# Patient Record
Sex: Male | Born: 1990 | Race: White | Hispanic: No | Marital: Married | Smoking: Never smoker
Health system: Southern US, Community
[De-identification: ages and names within clinical notes are randomized; demographics above are authoritative.]

## PROBLEM LIST (undated history)

## (undated) DIAGNOSIS — F419 Anxiety disorder, unspecified: Secondary | ICD-10-CM

## (undated) DIAGNOSIS — G43909 Migraine, unspecified, not intractable, without status migrainosus: Secondary | ICD-10-CM

## (undated) DIAGNOSIS — I10 Essential (primary) hypertension: Secondary | ICD-10-CM

## (undated) HISTORY — DX: Anxiety disorder, unspecified: F41.9

## (undated) HISTORY — DX: Migraine, unspecified, not intractable, without status migrainosus: G43.909

## (undated) HISTORY — PX: MYRINGOTOMY: SUR874

---

## 2013-08-18 ENCOUNTER — Encounter: Payer: Self-pay | Admitting: Physician Assistant

## 2013-08-18 ENCOUNTER — Ambulatory Visit (INDEPENDENT_AMBULATORY_CARE_PROVIDER_SITE_OTHER): Payer: BC Managed Care – PPO | Admitting: Physician Assistant

## 2013-08-18 VITALS — BP 120/65 | HR 101 | Ht 71.0 in | Wt 203.0 lb

## 2013-08-18 DIAGNOSIS — I499 Cardiac arrhythmia, unspecified: Secondary | ICD-10-CM | POA: Insufficient documentation

## 2013-08-18 DIAGNOSIS — F329 Major depressive disorder, single episode, unspecified: Secondary | ICD-10-CM | POA: Insufficient documentation

## 2013-08-18 DIAGNOSIS — F341 Dysthymic disorder: Secondary | ICD-10-CM

## 2013-08-18 DIAGNOSIS — F32A Depression, unspecified: Secondary | ICD-10-CM | POA: Insufficient documentation

## 2013-08-18 DIAGNOSIS — F410 Panic disorder [episodic paroxysmal anxiety] without agoraphobia: Secondary | ICD-10-CM

## 2013-08-18 MED ORDER — CITALOPRAM HYDROBROMIDE 10 MG PO TABS: 10.0000 mg | ORAL_TABLET | Freq: Every day | ORAL | Status: DC

## 2013-08-18 MED ORDER — CLONAZEPAM 0.5 MG PO TABS: ORAL_TABLET | ORAL | Status: DC

## 2013-08-18 NOTE — Patient Instructions (Signed)

## 2013-08-18 NOTE — Progress Notes (Signed)
   Subjective:    Patient ID: Juan Andersen, male    DOB: Apr 28, 1991, 22 y.o.   MRN: [id number]  HPI Patient is a 22 yo male who presents to the clinic to establish care and to discuss ongoing anxiety and depression. Pt has been on zoloft and xanax in the past. Xanax totally sedated him and made him not hisself. zoloft made him very nauseated. He suffers with anxiety and depression but depression is much worse. He does have thoughts that life would be better without him but has not made a plan in years. He can also get very anxious and have panic attacks. Hx of panic attacks that were so bad that his arms and legs went numb. No recent stressors.   Per pt seen by cardiologist for irregular heart beat. No treatment initiated. No SOB, CP. Does not exercise. No problems or concerns today.        Review of Systems  All other systems reviewed and are negative.       Objective:   Physical Exam  Constitutional: He is oriented to person, place, and time. He appears well-developed and well-nourished.  HENT:  Head: Normocephalic and atraumatic.  Cardiovascular: Regular rhythm and normal heart sounds.   Tachycardia at 101.   Pulmonary/Chest: Effort normal and breath sounds normal.  Neurological: He is alert and oriented to person, place, and time.  Skin: Skin is warm and dry.  Psychiatric: He has a normal mood and affect. His behavior is normal.          Assessment & Plan:  Anxiety/depression/panic attacks- PHQ-9 was 17. GAD-7 was 17. Will start celexa 10mg  daily and klonapin for panic attacks only. Discussed side effects or medication. Stop if worsens depression. Only use klonapin PrN. F/U in 6 weeks.   Irregular heartbeat- waiting for records from cardiologist to review and see if any follow up is needed. Pt is asymptomatic today.

## 2013-09-17 ENCOUNTER — Encounter: Payer: Self-pay | Admitting: Physician Assistant

## 2013-09-17 ENCOUNTER — Ambulatory Visit (INDEPENDENT_AMBULATORY_CARE_PROVIDER_SITE_OTHER): Payer: BC Managed Care – PPO | Admitting: Physician Assistant

## 2013-09-17 VITALS — BP 144/82 | HR 105 | Wt 213.0 lb

## 2013-09-17 DIAGNOSIS — A084 Viral intestinal infection, unspecified: Secondary | ICD-10-CM

## 2013-09-17 DIAGNOSIS — A088 Other specified intestinal infections: Secondary | ICD-10-CM

## 2013-09-17 MED ORDER — PROMETHAZINE HCL 25 MG PO TABS: 25.0000 mg | ORAL_TABLET | Freq: Four times a day (QID) | ORAL | Status: DC | PRN

## 2013-09-17 MED ORDER — TRAMADOL HCL 50 MG PO TABS: 50.0000 mg | ORAL_TABLET | Freq: Four times a day (QID) | ORAL | Status: DC | PRN

## 2013-09-17 MED ORDER — DICYCLOMINE HCL 10 MG PO CAPS: 10.0000 mg | ORAL_CAPSULE | Freq: Three times a day (TID) | ORAL | Status: DC

## 2013-09-17 NOTE — Patient Instructions (Addendum)
Viral Gastroenteritis  Viral gastroenteritis is also known as stomach flu. This condition affects the stomach and intestinal tract. It can cause sudden diarrhea and vomiting. The illness typically lasts 3 to 8 days. Most people develop an immune response that eventually gets rid of the virus. While this natural response develops, the virus can make you quite ill.  CAUSES   Many different viruses can cause gastroenteritis, such as rotavirus or noroviruses. You can catch one of these viruses by consuming contaminated food or water. You may also catch a virus by sharing utensils or other personal items with an infected person or by touching a contaminated surface.  SYMPTOMS   The most common symptoms are diarrhea and vomiting. These problems can cause a severe loss of body fluids (dehydration) and a body salt (electrolyte) imbalance. Other symptoms may include:  · Fever.  · Headache.  · Fatigue.  · Abdominal pain.  DIAGNOSIS   Your caregiver can usually diagnose viral gastroenteritis based on your symptoms and a physical exam. A stool sample may also be taken to test for the presence of viruses or other infections.  TREATMENT   This illness typically goes away on its own. Treatments are aimed at rehydration. The most serious cases of viral gastroenteritis involve vomiting so severely that you are not able to keep fluids down. In these cases, fluids must be given through an intravenous line (IV).  HOME CARE INSTRUCTIONS   · Drink enough fluids to keep your urine clear or pale yellow. Drink small amounts of fluids frequently and increase the amounts as tolerated.  · Ask your caregiver for specific rehydration instructions.  · Avoid:  · Foods high in sugar.  · Alcohol.  · Carbonated drinks.  · Tobacco.  · Juice.  · Caffeine drinks.  · Extremely hot or cold fluids.  · Fatty, greasy foods.  · Too much intake of anything at one time.  · Dairy products until 24 to 48 hours after diarrhea stops.  · You may consume probiotics.  Probiotics are active cultures of beneficial bacteria. They may lessen the amount and number of diarrheal stools in adults. Probiotics can be found in yogurt with active cultures and in supplements.  · Wash your hands well to avoid spreading the virus.  · Only take over-the-counter or prescription medicines for pain, discomfort, or fever as directed by your caregiver. Do not give aspirin to children. Antidiarrheal medicines are not recommended.  · Ask your caregiver if you should continue to take your regular prescribed and over-the-counter medicines.  · Keep all follow-up appointments as directed by your caregiver.  SEEK IMMEDIATE MEDICAL CARE IF:   · You are unable to keep fluids down.  · You do not urinate at least once every 6 to 8 hours.  · You develop shortness of breath.  · You notice blood in your stool or vomit. This may look like coffee grounds.  · You have abdominal pain that increases or is concentrated in one small area (localized).  · You have persistent vomiting or diarrhea.  · You have a fever.  · The patient is a child younger than 3 months, and he or she has a fever.  · The patient is a child older than 3 months, and he or she has a fever and persistent symptoms.  · The patient is a child older than 3 months, and he or she has a fever and symptoms suddenly get worse.  · The patient is a baby, and he   or she has no tears when crying.  MAKE SURE YOU:   · Understand these instructions.  · Will watch your condition.  · Will get help right away if you are not doing well or get worse.  Document Released: 08/28/2005 Document Revised: 11/20/2011 Document Reviewed: 06/14/2011  ExitCare® Patient Information ©2014 [employer].  Diet for Diarrhea, Adult  Frequent, runny stools (diarrhea) may be caused or worsened by food or drink. Diarrhea may be relieved by changing your diet. Since diarrhea can last up to 7 days, it is easy for you to lose too much fluid from the body and become dehydrated. Fluids that are  lost need to be replaced. Along with a modified diet, make sure you drink enough fluids to keep your urine clear or pale yellow.  DIET INSTRUCTIONS  · Ensure adequate fluid intake (hydration): have 1 cup (8 oz) of fluid for each diarrhea episode. Avoid fluids that contain simple sugars or sports drinks, fruit juices, whole milk products, and sodas. Your urine should be clear or pale yellow if you are drinking enough fluids. Hydrate with an oral rehydration solution that you can purchase at pharmacies, retail stores, and online. You can prepare an oral rehydration solution at home by mixing the following ingredients together:  ·   tsp table salt.  · ¾ tsp baking soda.  ·  tsp salt substitute containing potassium chloride.  · 1  tablespoons sugar.  · 1 L (34 oz) of water.  · Certain foods and beverages may increase the speed at which food moves through the gastrointestinal (GI) tract. These foods and beverages should be avoided and include:  · Caffeinated and alcoholic beverages.  · High-fiber foods, such as raw fruits and vegetables, nuts, seeds, and whole grain breads and cereals.  · Foods and beverages sweetened with sugar alcohols, such as xylitol, sorbitol, and mannitol.  · Some foods may be well tolerated and may help thicken stool including:  · Starchy foods, such as rice, toast, pasta, low-sugar cereal, oatmeal, grits, baked potatoes, crackers, and bagels.    · Bananas.    · Applesauce.  · Add probiotic-rich foods to help increase healthy bacteria in the GI tract, such as yogurt and fermented milk products.  RECOMMENDED FOODS AND BEVERAGES  Starches  Choose foods with less than 2 g of fiber per serving.  · Recommended:  White, French, and pita breads, plain rolls, buns, bagels. Plain muffins, matzo. Soda, saltine, or graham crackers. Pretzels, melba toast, zwieback. Cooked cereals made with water: cornmeal, farina, cream cereals. Dry cereals: refined corn, wheat, rice. Potatoes prepared any way without skins,  refined macaroni, spaghetti, noodles, refined rice.  · Avoid:  Bread, rolls, or crackers made with whole wheat, multi-grains, rye, bran seeds, nuts, or coconut. Corn tortillas or taco shells. Cereals containing whole grains, multi-grains, bran, coconut, nuts, raisins. Cooked or dry oatmeal. Coarse wheat cereals, granola. Cereals advertised as "high-fiber." Potato skins. Whole grain pasta, wild or brown rice. Popcorn. Sweet potatoes, yams. Sweet rolls, doughnuts, waffles, pancakes, sweet breads.  Vegetables  · Recommended: Strained tomato and vegetable juices. Most well-cooked and canned vegetables without seeds. Fresh: Tender lettuce, cucumber without the skin, cabbage, spinach, bean sprouts.  · Avoid: Fresh, cooked, or canned: Artichokes, baked beans, beet greens, broccoli, Brussels sprouts, corn, kale, legumes, peas, sweet potatoes. Cooked: Green or red cabbage, spinach. Avoid large servings of any vegetables because vegetables shrink when cooked, and they contain more fiber per serving than fresh vegetables.  Fruit  · Recommended: Cooked   or canned: Apricots, applesauce, cantaloupe, cherries, fruit cocktail, grapefruit, grapes, kiwi, mandarin oranges, peaches, pears, plums, watermelon. Fresh: Apples without skin, ripe banana, grapes, cantaloupe, cherries, grapefruit, peaches, oranges, plums. Keep servings limited to ½ cup or 1 piece.  · Avoid: Fresh: Apples with skin, apricots, mangoes, pears, raspberries, strawberries. Prune juice, stewed or dried prunes. Dried fruits, raisins, dates. Large servings of all fresh fruits.  Protein  · Recommended: Ground or well-cooked tender beef, ham, veal, lamb, pork, or poultry. Eggs. Fish, oysters, shrimp, lobster, other seafoods. Liver, organ meats.  · Avoid: Tough, fibrous meats with gristle. Peanut butter, smooth or chunky. Cheese, nuts, seeds, legumes, dried peas, beans, lentils.  Dairy  · Recommended: Yogurt, lactose-free milk, kefir, drinkable yogurt, buttermilk, soy  milk, or plain hard cheese.  · Avoid: Milk, chocolate milk, beverages made with milk, such as milkshakes.  Soups  · Recommended: Bouillon, broth, or soups made from allowed foods. Any strained soup.  · Avoid: Soups made from vegetables that are not allowed, cream or milk-based soups.  Desserts and Sweets  · Recommended: Sugar-free gelatin, sugar-free frozen ice pops made without sugar alcohol.  · Avoid: Plain cakes and cookies, pie made with fruit, pudding, custard, cream pie. Gelatin, fruit, ice, sherbet, frozen ice pops. Ice cream, ice milk without nuts. Plain hard candy, honey, jelly, molasses, syrup, sugar, chocolate syrup, gumdrops, marshmallows.  Fats and Oils  · Recommended: Limit fats to less than 8 tsp per day.  · Avoid: Seeds, nuts, olives, avocados. Margarine, butter, cream, mayonnaise, salad oils, plain salad dressings. Plain gravy, crisp bacon without rind.  Beverages  · Recommended: Water, decaffeinated teas, oral rehydration solutions, sugar-free beverages not sweetened with sugar alcohols.  · Avoid: Fruit juices, caffeinated beverages (coffee, tea, soda), alcohol, sports drinks, or lemon-lime soda.  Condiments  · Recommended: Ketchup, mustard, horseradish, vinegar, cocoa powder. Spices in moderation: allspice, basil, bay leaves, celery powder or leaves, cinnamon, cumin powder, curry powder, ginger, mace, marjoram, onion or garlic powder, oregano, paprika, parsley flakes, ground pepper, rosemary, sage, savory, tarragon, thyme, turmeric.  · Avoid: Coconut, honey.  Document Released: 11/18/2003 Document Revised: 05/22/2012 Document Reviewed: 01/12/2012  ExitCare® Patient Information ©2014 [employer].

## 2013-09-17 NOTE — Progress Notes (Signed)
   Subjective:    Patient ID: Juan Andersen, male    DOB: 10-24-1990, 23 y.o.   MRN: [id number]  HPI Patient presents to the clinic as a 23 year old white male with 2 days of nausea and vomiting. Symptoms started 2 days ago with nausea and abdominal discomfort. Symptoms have progressed to abdominal cramping and pain and ongoing vomiting. Last time he vomited was this morning. He is able to keep most fluids down. He did decide to eat today and chose [employer] Big Mac. He soon felt a lot of abdominal cramping and pain and nausea. He did not throw up after lunch. Patient does feel achy throughout his body. He tried to go to work yesterday but felt too dizzy to run machinery. Denies any diarrhea. Has not ran a fever. Denies any sore throat, ear pain, sinus pressure, shortness of breath, cough or wheezing.    Review of Systems     Objective:   Physical Exam  Constitutional: He is oriented to person, place, and time. He appears well-developed and well-nourished.  HENT:  Head: Normocephalic and atraumatic.  Right Ear: External ear normal.  Left Ear: External ear normal.  Mouth/Throat: Oropharynx is clear and moist. No oropharyngeal exudate.  Eyes: Conjunctivae are normal.  Neck: Normal range of motion. Neck supple.  Cardiovascular: Normal rate, regular rhythm and normal heart sounds.   Pulmonary/Chest: Effort normal and breath sounds normal. He has no wheezes.  Abdominal: Soft. Bowel sounds are normal. There is tenderness.  Generalized tenderness over entire abdomen. Negative Murphy sign.  Lymphadenopathy:    He has no cervical adenopathy.  Neurological: He is alert and oriented to person, place, and time.  Skin: Skin is warm and dry.  Psychiatric: He has a normal mood and affect. His behavior is normal.          Assessment & Plan:  Viral gastroenteritis- gave patient handout on symptomatic control. Give patient Phenergan to take orally at home. Also sent home and told to use up to 4 times  a day for abdominal cramping and spasms. Discussed progressing his diet slowly. Discouraged harsh foods such as [employer]. Continue to stay hydrated to avoid dehydration. Discussed adding Gatorade to give electrolytes. 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 Call if not improving.

## 2013-10-01 ENCOUNTER — Ambulatory Visit (INDEPENDENT_AMBULATORY_CARE_PROVIDER_SITE_OTHER): Payer: BC Managed Care – PPO | Admitting: Physician Assistant

## 2013-10-01 ENCOUNTER — Encounter: Payer: Self-pay | Admitting: Physician Assistant

## 2013-10-01 VITALS — BP 133/79 | HR 86 | Wt 212.0 lb

## 2013-10-01 DIAGNOSIS — F419 Anxiety disorder, unspecified: Principal | ICD-10-CM

## 2013-10-01 DIAGNOSIS — F329 Major depressive disorder, single episode, unspecified: Secondary | ICD-10-CM

## 2013-10-01 DIAGNOSIS — F341 Dysthymic disorder: Secondary | ICD-10-CM

## 2013-10-01 DIAGNOSIS — F410 Panic disorder [episodic paroxysmal anxiety] without agoraphobia: Secondary | ICD-10-CM

## 2013-10-01 MED ORDER — CITALOPRAM HYDROBROMIDE 20 MG PO TABS: 20.0000 mg | ORAL_TABLET | Freq: Every day | ORAL | Status: DC

## 2013-10-01 NOTE — Progress Notes (Signed)
   Subjective:    Patient ID: Juan ParsleyCody Andersen, male    DOB: 08-Mar-1991, 23 y.o.   MRN: [id number]  HPI Pt presents to the clinic to follow up on anxiety/depression and panic attacks. Pt is taking celexa at bedtime and klonapin as needed. He has only had to take klonapin a few times. There was some nausea with taking celexa in the am but taking at bedtime has worked out. He does feel like he has had "cold sensation" and hot sensations a lot with medication. He reports they are only in public and when he is out and about. Admits he has social anxiety problems. He also has some tingling all over body. Episodes only last a few seconds to minutes. Resolve on there on. Denies any palpitations, CP, vision changes.    Review of Systems     Objective:   Physical Exam  Constitutional: He is oriented to person, place, and time. He appears well-developed and well-nourished.  HENT:  Head: Normocephalic and atraumatic.  Cardiovascular: Normal rate, regular rhythm and normal heart sounds.   Pulmonary/Chest: Effort normal and breath sounds normal.  Neurological: He is alert and oriented to person, place, and time.  Skin: Skin is warm and dry.  Psychiatric: He has a normal mood and affect. His behavior is normal.          Assessment & Plan:  Anxiety and Depression/panic attacks- PHQ-9 was 8 and GAD was 7. Numbers have significantly improved. Increased celexa to 20mg  daily at night. klonapin only as needed. Pt reports feelings of cold sensation/hot sensations/tingling over body but sounds like he could be having a panic attack. Encouraged him to use klonapin and see if helped it would then know is panic attack. Hopefully celexa will decrease frequency. Pt concerned cold sensation could be side effect of celexa because they have worsened over 6 weeks. If worsens with increase call office and will change celexa. Discussed celexa can worsen anxiety as SE;however, numbers are better than ever so is helping. Will  continue to monitor. F/u in 6-8 weeks.

## 2013-10-18 ENCOUNTER — Other Ambulatory Visit: Payer: Self-pay | Admitting: Physician Assistant

## 2013-11-12 ENCOUNTER — Ambulatory Visit: Payer: BC Managed Care – PPO | Admitting: Physician Assistant

## 2013-11-12 DIAGNOSIS — Z0289 Encounter for other administrative examinations: Secondary | ICD-10-CM

## 2014-01-31 ENCOUNTER — Other Ambulatory Visit: Payer: Self-pay | Admitting: Physician Assistant

## 2015-03-31 ENCOUNTER — Emergency Department (INDEPENDENT_AMBULATORY_CARE_PROVIDER_SITE_OTHER)
Admission: EM | Admit: 2015-03-31 | Discharge: 2015-03-31 | Disposition: A | Discharge status: Home / Self Care | Payer: Self-pay | Source: Home / Self Care | Attending: Family Medicine | Admitting: Family Medicine

## 2015-03-31 ENCOUNTER — Encounter: Payer: Self-pay | Admitting: *Deleted

## 2015-03-31 ENCOUNTER — Emergency Department (INDEPENDENT_AMBULATORY_CARE_PROVIDER_SITE_OTHER): Payer: Self-pay

## 2015-03-31 DIAGNOSIS — M79662 Pain in left lower leg: Secondary | ICD-10-CM

## 2015-03-31 DIAGNOSIS — L03116 Cellulitis of left lower limb: Secondary | ICD-10-CM

## 2015-03-31 DIAGNOSIS — S8992XA Unspecified injury of left lower leg, initial encounter: Secondary | ICD-10-CM

## 2015-03-31 MED ORDER — CEPHALEXIN 500 MG PO CAPS: 500.0000 mg | ORAL_CAPSULE | Freq: Two times a day (BID) | ORAL | Status: DC

## 2015-03-31 NOTE — Discharge Instructions (Signed)
You may take ibuprofen  every 6-8 hours as needed for pain and swelling.  Keep your leg elevated when you can to help keep swelling down. Be sure to keep area clean with soap and water.  Take antibiotics as prescribed, and complete the entire course to help prevent infection from returning or worsening.  If symptoms do worsen including significant swelling, pain, worsening redness, or other new concerning symptoms develop, please seek medical attention for further evaluation and treatment. See below for further instructions.

## 2015-03-31 NOTE — ED Notes (Signed)
Pt employer, [employer] requested UDS. Done.

## 2015-03-31 NOTE — ED Provider Notes (Signed)
CSN: [id number]     Arrival date & time 03/31/15  1623 History   First MD Initiated Contact with Patient 03/31/15 1637     Chief Complaint  Patient presents with  . Leg Pain   (Consider location/radiation/quality/duration/timing/severity/associated sxs/prior Treatment) HPI Patient is a 24 year old male presenting to urgent care with complaints of left lower leg pain after injury at work 2 days ago.  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 He was helping his partner load a "fluid filled wall" into the back of a truck when his partner accidentally lost his grip, causing the wall to hit the patients Left lower leg and then slide down his leg. States his leg was caught between the wall and the truck for a few seconds. He experienced immediate pain and swelling to area. Pain is aching and sore, 4/10, worse with ambulation and palpation. States swelling in lower leg was worse yesterday but has improved significantly with ibuprofen and ice.  Pt states he still has a red warm bump to the front of his left lower leg. No other injuries.   History reviewed. No pertinent past medical history. History reviewed. No pertinent past surgical history. Family History  Problem Relation Age of Onset  . Hyperlipidemia Father   . Hypertension Father   . Alcohol abuse Maternal Uncle   . Depression Maternal Uncle   . Diabetes Paternal Aunt   . Hyperlipidemia Paternal Aunt   . Alcohol abuse Paternal Uncle   . Hyperlipidemia Paternal Uncle   . Diabetes Maternal Grandmother   . Stroke Maternal Grandmother   . Cancer Maternal Grandfather   . Heart attack Paternal Grandfather   . Diabetes Paternal Grandfather   . Hyperlipidemia Paternal Grandfather    History  Substance Use Topics  . Smoking status: Never Smoker   . Smokeless tobacco: Not on file  . Alcohol Use: No    Review of Systems  Musculoskeletal: Positive for myalgias, joint swelling, arthralgias and gait problem. Negative for  back pain, neck pain and neck stiffness.  Skin: Positive for color change. Negative for pallor, rash and wound.  Neurological: Negative for weakness and numbness.    Allergies  Review of patient's allergies indicates no known allergies.  Home Medications   Prior to Admission medications   Medication Sig Start Date End Date Taking? Authorizing Provider  cephALEXin (KEFLEX) 500 MG capsule Take 1 capsule (500 mg total) by mouth 2 (two) times daily. For 7 days 03/31/15   Junius Finner, PA-C  citalopram (CELEXA) 10 MG tablet TAKE ONE TABLET BY MOUTH ONE TIME DAILY  10/18/13   Jomarie Longs, PA-C  clonazePAM (KLONOPIN) 0.5 MG tablet Take one tablet as needed for panic attacks. 08/18/13   [name] L Breeback, PA-C   BP 154/89 mmHg  Pulse 83  Resp 16  Wt 234 lb (106.142 kg)  SpO2 99% Physical Exam  Constitutional: He is oriented to person, place, and time. He appears well-developed and well-nourished.  HENT:  Head: Normocephalic and atraumatic.  Eyes: EOM are normal.  Neck: Normal range of motion.  Cardiovascular: Normal rate.   Pulmonary/Chest: Effort normal.  Musculoskeletal: Normal range of motion. He exhibits edema and tenderness.  Left Leg: FROM Left knee, non-tender. No tenderness to proximal tibia and fibular. Calf is soft, no tenderness to calf muscle. Tenderness and mild edema with hematoma over anterior distal tibia. Significant tenderness. (See skin exam)  Left ankle and foot: non-tender, FROM. Increased pain in Lower leg with full dorsiflexion.  Neurological: He is alert and oriented to person, place, and time.  Skin: Skin is warm and dry. There is erythema.  Left lower anterior leg: 3cm area of erythema and warmth, centralized area of edema and tenderness. No fluctuance. No bleeding or discharge.  Psychiatric: He has a normal mood and affect. His behavior is normal.  Nursing note and vitals reviewed.   ED Course  Procedures (including critical care time) Labs Review Labs  Reviewed - No data to display  Imaging Review Dg Tibia/fibula Left  03/31/2015   CLINICAL DATA:  Injury to LEFT lower leg 3 days ago, a barrier filled with water fell onto LEFT leg, patient having swelling, tenderness and redness over distal anterior LEFT lower leg  EXAM: LEFT TIBIA AND FIBULA - 2 VIEW  COMPARISON:  None  FINDINGS: Knee and ankle joint alignments normal.  Osseous mineralization normal for technique.  No acute fracture, dislocation or bone destruction.  Minimal soft tissue swelling pretibial at the distal lower leg as well as over the dorsum of the midfoot.  IMPRESSION: No acute osseous abnormalities.   Electronically Signed   By: Ulyses SouthwardMark  Boles M.D.   On: 03/31/2015 17:32     MDM   1. Injury, lower leg, left, initial encounter   2. Cellulitis of left lower leg     [facility] pain redness, swelling and warmth after injury at work 2 days ago. Symptoms are improving except continue redness and warmth at injury site.  Doubt compartment syndrome as compartments are soft. Small area of erythema c/w hematoma vs early onset cellulitis w/o abscess. Will place pt on 7 day course of keflex. Advised he may continue to take ibuprofen, up to 600mg  every 6-8 hours as needed for pain and swelling. Encouraged to use R.I.C.E therapy. Ace wrap provided today in UC.  Pt may return to work with 24 hours of light duty.  Requested pt be able to elevate leg during breaks for 10-15 minutes at a time. Return precautions provided. Pt verbalized understanding and agreement with tx plan.     Junius FinnerErin O'Malley, PA-C 03/31/15 [phone]

## 2015-03-31 NOTE — ED Notes (Signed)
Pt reports while @ work 2 days ago, [employer], [employer], his left lower leg was hit with a "fliud filled wall", smashed between that and a trailer. Redness and swelling present. Taking IBF otc.

## 2015-08-30 ENCOUNTER — Ambulatory Visit (INDEPENDENT_AMBULATORY_CARE_PROVIDER_SITE_OTHER): Payer: 59 | Admitting: Physician Assistant

## 2015-08-30 ENCOUNTER — Encounter: Payer: Self-pay | Admitting: Physician Assistant

## 2015-08-30 VITALS — BP 150/91 | HR 86 | Ht 71.0 in | Wt 240.0 lb

## 2015-08-30 DIAGNOSIS — I10 Essential (primary) hypertension: Secondary | ICD-10-CM | POA: Diagnosis not present

## 2015-08-30 DIAGNOSIS — F410 Panic disorder [episodic paroxysmal anxiety] without agoraphobia: Secondary | ICD-10-CM | POA: Diagnosis not present

## 2015-08-30 DIAGNOSIS — F329 Major depressive disorder, single episode, unspecified: Secondary | ICD-10-CM

## 2015-08-30 DIAGNOSIS — F418 Other specified anxiety disorders: Secondary | ICD-10-CM

## 2015-08-30 DIAGNOSIS — F419 Anxiety disorder, unspecified: Secondary | ICD-10-CM

## 2015-08-30 MED ORDER — ALPRAZOLAM 0.5 MG PO TABS: 0.5000 mg | ORAL_TABLET | Freq: Two times a day (BID) | ORAL | Status: DC | PRN

## 2015-08-30 MED ORDER — BUPROPION HCL ER (XL) 150 MG PO TB24: 150.0000 mg | ORAL_TABLET | Freq: Every day | ORAL | Status: DC

## 2015-08-31 DIAGNOSIS — I10 Essential (primary) hypertension: Secondary | ICD-10-CM | POA: Insufficient documentation

## 2015-08-31 NOTE — Progress Notes (Signed)
   Subjective:    Patient ID: Juan ParsleyCody Gangi, male    DOB: 11-10-1990, 24 y.o.   MRN: [id number]  HPI Patient is a 24 year old male who presents to the clinic to follow-up on anxiety and depression. He had switched to a doctor's office closer to his house but did not really like the office. He would like to start coming here again. While he was there he was started on lisinopril for his high blood pressure. He has ran out and not taking the last 5 days. He denies any chest pain, dizziness, headaches or palpitations. His main concern is his chronic anxiety and panic attacks. He does feel like his anxiety is getting worse. On average she is having 3 panic attacks a day. During the panic attacks he gets extremely hot and sweaty. He feels like he has to sit down or he is going to pass out. He is unsure of the exact trigger however it seems to be a lot of public situations. He has witnessed a lot of death in his lifetime and he has notice his panic gets worse around death. His grandmother recently passed away as well. He takes Klonopin only as needed but does not feel like it gets rid of the panic attacks as it should. Sometimes it will take him 1 hour to calm down after a panic attack. He denies any suicidal or homicidal thoughts. His is on paxil but has not noticed any benefit.    Review of Systems  All other systems reviewed and are negative.      Objective:   Physical Exam  Constitutional: He is oriented to person, place, and time. He appears well-developed and well-nourished.  HENT:  Head: Normocephalic and atraumatic.  Cardiovascular: Normal rate, regular rhythm and normal heart sounds.   Pulmonary/Chest: Effort normal and breath sounds normal.  Neurological: He is alert and oriented to person, place, and time.  Skin:  Flushed cheeks   Psychiatric: He has a normal mood and affect. His behavior is normal.          Assessment & Plan:  HTN- discuss with patient his blood pressure is very  elevated today. He needs to make sure he is taking his lisinopril. Made sure he had refills the patient is to have blood pressure rechecked in the next month.  Anxiety/depression/panic attacks-pH Q9 was 8 and GAD-7 was 14. I did switch klonapin to xanax up to twice a day. Discussed abuse potential and to only use as needed. Patient was encouraged to stay on Paxil. I did add Wellbutrin 150 mg. We'll recheck in 4-6 weeks.

## 2015-09-09 ENCOUNTER — Emergency Department (INDEPENDENT_AMBULATORY_CARE_PROVIDER_SITE_OTHER)
Admission: EM | Admit: 2015-09-09 | Discharge: 2015-09-09 | Disposition: A | Discharge status: Home / Self Care | Payer: 59 | Source: Home / Self Care

## 2015-09-09 ENCOUNTER — Encounter: Payer: Self-pay | Admitting: *Deleted

## 2015-09-09 ENCOUNTER — Emergency Department (INDEPENDENT_AMBULATORY_CARE_PROVIDER_SITE_OTHER): Payer: 59

## 2015-09-09 DIAGNOSIS — M79671 Pain in right foot: Secondary | ICD-10-CM

## 2015-09-09 DIAGNOSIS — S86311A Strain of muscle(s) and tendon(s) of peroneal muscle group at lower leg level, right leg, initial encounter: Secondary | ICD-10-CM

## 2015-09-09 DIAGNOSIS — M25571 Pain in right ankle and joints of right foot: Secondary | ICD-10-CM

## 2015-09-09 DIAGNOSIS — S93601A Unspecified sprain of right foot, initial encounter: Secondary | ICD-10-CM

## 2015-09-09 DIAGNOSIS — M76821 Posterior tibial tendinitis, right leg: Secondary | ICD-10-CM | POA: Diagnosis not present

## 2015-09-09 HISTORY — DX: Essential (primary) hypertension: I10

## 2015-09-09 MED ORDER — PREDNISONE 20 MG PO TABS: ORAL_TABLET | ORAL | Status: DC

## 2015-09-09 MED ORDER — TRAMADOL HCL 50 MG PO TABS: ORAL_TABLET | ORAL | Status: DC

## 2015-09-09 NOTE — ED Notes (Signed)
Pt needs UDS per supervisor Warden FillersSam Edwards. Done. Clemens Catholichristy Grettell Ransdell, LPN

## 2015-09-09 NOTE — ED Notes (Addendum)
Workers comp [employer]: Pt c/o RT foot/ankle pain after slipping on a hill side at work and twisting his ankle on 09/07/15.

## 2015-09-09 NOTE — Discharge Instructions (Signed)
Apply ice pack for 30 minutes every 1 to 2 hours today and tomorrow.  Elevate.  Use crutches for 3 to 5 days.  Wear Ace wrap until swelling decreases.  Wear brace for about 2 to 3 weeks.  Begin range of motion and stretching exercises in about 5 days as per instruction sheet. ° °

## 2015-09-09 NOTE — ED Provider Notes (Signed)
CSN: [id number]     Arrival date & time 09/09/15  1634 History   None    Chief Complaint  Patient presents with  . Foot Pain      HPI Comments: While on a hill 48 hours ago, patient slipped sideways, injuring his right ankle.  He now has difficulty bearing weight.  Patient is a 24 y.o. male presenting with ankle pain. The history is provided by the patient.  Ankle Pain Location:  Ankle Time since incident:  2 days Injury: yes   Mechanism of injury: fall   Fall:    Fall occurred: on a hill.   Impact surface:  Grass Pain details:    Quality:  Aching   Radiates to:  R leg   Severity:  Moderate   Onset quality:  Sudden   Duration:  2 days   Timing:  Constant   Progression:  Unchanged Chronicity:  New Dislocation: no   Prior injury to area:  No Relieved by:  Nothing Worsened by:  Bearing weight Ineffective treatments:  None tried Associated symptoms: decreased ROM and stiffness   Associated symptoms: no back pain, no muscle weakness and no numbness   Risk factors: obesity     Past Medical History  Diagnosis Date  . Hypertension    Past Surgical History  Procedure Laterality Date  . Myringotomy     Family History  Problem Relation Age of Onset  . Hyperlipidemia Father   . Hypertension Father   . Alcohol abuse Maternal Uncle   . Depression Maternal Uncle   . Diabetes Paternal Aunt   . Hyperlipidemia Paternal Aunt   . Alcohol abuse Paternal Uncle   . Hyperlipidemia Paternal Uncle   . Diabetes Maternal Grandmother   . Stroke Maternal Grandmother   . Cancer Maternal Grandfather   . Heart attack Paternal Grandfather   . Diabetes Paternal Grandfather   . Hyperlipidemia Paternal Grandfather    Social History  Substance Use Topics  . Smoking status: Never Smoker   . Smokeless tobacco: None  . Alcohol Use: No    Review of Systems  Musculoskeletal: Positive for stiffness. Negative for back pain.  All other systems reviewed and are negative.   Allergies    Benadryl  Home Medications   Prior to Admission medications   Medication Sig Start Date End Date Taking? Authorizing Provider  buPROPion (WELLBUTRIN XL) 150 MG 24 hr tablet Take 150 mg by mouth daily.   Yes Historical Provider, MD  ALPRAZolam Prudy Feeler) 0.5 MG tablet Take 1 tablet (0.5 mg total) by mouth 2 (two) times daily as needed for anxiety. 08/30/15   [name] L Breeback, PA-C  lisinopril (PRINIVIL,ZESTRIL) 20 MG tablet Take 20 mg by mouth daily.    Historical Provider, MD  predniSONE (DELTASONE) 20 MG tablet Take 2 tabs once daily for 4 days, then one daily for 3 days. Take with food. 09/09/15   Lattie Haw, MD  traMADol Janean Sark) 50 MG tablet Take one tab at bedtime as needed for pain 09/09/15   Lattie Haw, MD   Meds Ordered and Administered this Visit  Medications - No data to display  BP 122/82 mmHg  Pulse 100  Temp(Src) 97.8 F (36.6 C) (Oral)  Resp 18  Ht  (1.803 m)  Wt 240 lb (108.863 kg)  BMI 33.49 kg/m2  SpO2 98% No data found.   Physical Exam  Constitutional: He is oriented to person, place, and time. He appears well-developed and well-nourished. No distress.  HENT:  Head: Atraumatic.  Eyes: Conjunctivae are normal. Pupils are equal, round, and reactive to light.  Neck: Normal range of motion.  Cardiovascular: Normal heart sounds.   Pulmonary/Chest: Breath sounds normal.  Abdominal: There is no tenderness.  Musculoskeletal: He exhibits no edema.       Right ankle: He exhibits decreased range of motion. He exhibits no swelling, no ecchymosis, no deformity and normal pulse. Tenderness. Lateral malleolus and medial malleolus tenderness found. No head of 5th metatarsal and no proximal fibula tenderness found.       Feet:  There is tenderness to palpation over the right peroneal tendon and posterior tibial tendon  Neurological: He is alert and oriented to person, place, and time.  Skin: Skin is warm and dry.  Nursing note and vitals reviewed.   ED  Course  Procedures  None   Imaging Review Dg Ankle Complete Right  09/09/2015  CLINICAL DATA:  Right foot/ankle pain 2 days after twisting injury. Pain and swelling worse laterally. EXAM: RIGHT ANKLE - COMPLETE 3+ VIEW COMPARISON:  None. FINDINGS: Ankle mortise is within normal. There is no acute fracture or dislocation. Remaining bones soft tissues are within normal. IMPRESSION: No acute fracture. Electronically Signed   By: Elberta Fortisaniel  Boyle M.D.   On: 09/09/2015 17:39   Dg Foot Complete Right  09/09/2015  CLINICAL DATA:  Pain after rolling injury 2 days prior EXAM: RIGHT FOOT COMPLETE - 3+ VIEW COMPARISON:  None. FINDINGS: Frontal, oblique, and lateral views were obtained. There is no fracture or dislocation. Joint spaces appear intact. No erosive change. IMPRESSION: No fracture or dislocation.  No appreciable arthropathy. Electronically Signed   By: Bretta BangWilliam  Woodruff III M.D.   On: 09/09/2015 17:39      MDM   1. Strain of peroneal tendon, right, initial encounter   2. Posterior tibial tendonitis of right leg   3. Right foot sprain, initial encounter    Begin prednisone taper.  Rx for tramadol at bedtime prn. Ace wrap applied.  Dispensed crutches.  Dispensed AirCast stirrup splint.  Apply ice pack for 30 minutes every 1 to 2 hours today and tomorrow.  Elevate.  Use crutches for 3 to 5 days.  Wear Ace wrap until swelling decreases.  Wear brace for about 2 to 3 weeks.  Begin range of motion and stretching exercises in about 5 days as per instruction sheet. Followup in [facility] 5 days.    Lattie HawStephen A Beese, MD 09/16/15 2132

## 2015-09-27 ENCOUNTER — Telehealth: Payer: Self-pay | Admitting: Physician Assistant

## 2015-09-27 ENCOUNTER — Other Ambulatory Visit: Payer: Self-pay | Admitting: Physician Assistant

## 2015-09-27 ENCOUNTER — Ambulatory Visit: Payer: 59 | Admitting: Physician Assistant

## 2015-09-27 MED ORDER — ALPRAZOLAM 0.5 MG PO TABS: 0.5000 mg | ORAL_TABLET | Freq: Two times a day (BID) | ORAL | Status: DC | PRN

## 2015-09-27 NOTE — Telephone Encounter (Signed)
I printed. Can fax tomorrow.

## 2015-09-27 NOTE — Telephone Encounter (Signed)
Patient needs refill on xanax sent to pharmacy.  He has an appt in one month.  He came in today but there was some confusion with his appt time.  thanks

## 2015-09-28 NOTE — Telephone Encounter (Signed)
Done

## 2015-10-29 ENCOUNTER — Other Ambulatory Visit: Payer: Self-pay | Admitting: Physician Assistant

## 2015-10-29 ENCOUNTER — Ambulatory Visit: Payer: 59 | Admitting: Physician Assistant

## 2015-11-01 ENCOUNTER — Ambulatory Visit (INDEPENDENT_AMBULATORY_CARE_PROVIDER_SITE_OTHER): Payer: 59 | Admitting: Physician Assistant

## 2015-11-01 ENCOUNTER — Encounter: Payer: Self-pay | Admitting: Physician Assistant

## 2015-11-01 VITALS — BP 133/81 | HR 101 | Ht 71.0 in | Wt 234.0 lb

## 2015-11-01 DIAGNOSIS — I10 Essential (primary) hypertension: Secondary | ICD-10-CM | POA: Diagnosis not present

## 2015-11-01 DIAGNOSIS — F418 Other specified anxiety disorders: Secondary | ICD-10-CM | POA: Diagnosis not present

## 2015-11-01 DIAGNOSIS — F410 Panic disorder [episodic paroxysmal anxiety] without agoraphobia: Secondary | ICD-10-CM

## 2015-11-01 DIAGNOSIS — F329 Major depressive disorder, single episode, unspecified: Secondary | ICD-10-CM

## 2015-11-01 DIAGNOSIS — F419 Anxiety disorder, unspecified: Principal | ICD-10-CM

## 2015-11-01 MED ORDER — ALPRAZOLAM 0.5 MG PO TABS: 0.5000 mg | ORAL_TABLET | Freq: Two times a day (BID) | ORAL | Status: DC | PRN

## 2015-11-01 MED ORDER — BUPROPION HCL ER (XL) 300 MG PO TB24: 300.0000 mg | ORAL_TABLET | Freq: Every day | ORAL | Status: DC

## 2015-11-01 MED ORDER — LISINOPRIL 20 MG PO TABS: 20.0000 mg | ORAL_TABLET | Freq: Every day | ORAL | Status: DC

## 2015-11-01 NOTE — Progress Notes (Addendum)
   Subjective:    Patient ID: Juan Andersen, male    DOB: [birth year], 25 y.o.   MRN: [id number]  HPI  Patient is a 25 year old male presenting for follow up for generalized anxiety, panic and depression. Patient's most recent PHQ9 is 6 and GAD7 is 12.he does admit improvement since starting wellbutrin. He has stopped paxil. Patient states the only medication that completley relieves his symptoms is xanax. Patient states that he has been taking 0.5 mg of xanax no more than twice a day and reports some days once or not at all. Patient states that his panic attacks have decreased from five a week to one or two per week.  Patient states that he has noticed some improvement in his depressive symptoms from taking Wellbutrin. Patient states that he has been trying to exercise more and improve his diet. Patient denies homicidal and suicidal ideation.   Review of Systems    Please see HPI Objective:   Physical Exam  Constitutional: He is oriented to person, place, and time. He appears well-developed and well-nourished. No distress.  HENT:  Head: Normocephalic and atraumatic.  Right Ear: External ear normal.  Left Ear: External ear normal.  Patient's tympanic membranes are pearly with bony landmarks visible and without erythema bilaterally.    Eyes: Conjunctivae and EOM are normal. Pupils are equal, round, and reactive to light.  Neck: Normal range of motion. No thyromegaly present.  Cardiovascular: Normal rate, regular rhythm, normal heart sounds and intact distal pulses.   Pulmonary/Chest: Effort normal and breath sounds normal. No respiratory distress. He has no wheezes. He has no rales.  Abdominal: Soft. Bowel sounds are normal. He exhibits no distension. There is no tenderness. There is no rebound and no guarding.  Musculoskeletal: Normal range of motion.  Neurological: He is alert and oriented to person, place, and time. He has normal reflexes. No cranial nerve deficit.  Skin: Skin is warm and dry.  He is not diaphoretic.  Psychiatric: He has a normal mood and affect. His behavior is normal. Judgment and thought content normal.      Assessment & Plan:   1. Generalized Anxiety/Panic Attacks Patient education was provided regarding the addiction potential of xanax. Patient was advised to try lifestyle modification to manage panic and generalized anxiety, such as exercise and eliminating caffeine. Patient's xanax was reduced to forty-five 0.5mg  tablets with three refills. Patient was encouraged to make prescription last for six months.   2. Depression  Patient will continue to take Wellbutrin. Patient's Wellbutrin was increased to 300 mg per 24 hours.   3. Essential Hypertension Patient will continue taking Lisinopril.   4. Health Maintenance  Patient was advised that he was due for a physical. Patient's lipid panel was ordered. Patient's labs will be reviewed.

## 2015-11-13 ENCOUNTER — Other Ambulatory Visit: Payer: Self-pay | Admitting: Physician Assistant

## 2015-11-16 ENCOUNTER — Other Ambulatory Visit: Payer: Self-pay | Admitting: *Deleted

## 2015-11-16 MED ORDER — ALPRAZOLAM 0.5 MG PO TABS: 0.5000 mg | ORAL_TABLET | Freq: Two times a day (BID) | ORAL | Status: DC | PRN

## 2015-12-09 ENCOUNTER — Encounter: Payer: Self-pay | Admitting: Sports Medicine

## 2015-12-09 ENCOUNTER — Ambulatory Visit (INDEPENDENT_AMBULATORY_CARE_PROVIDER_SITE_OTHER): Payer: 59

## 2015-12-09 ENCOUNTER — Emergency Department
Admission: EM | Admit: 2015-12-09 | Discharge: 2015-12-09 | Discharge status: Absent without leave | Payer: 59 | Source: Home / Self Care

## 2015-12-09 ENCOUNTER — Ambulatory Visit (INDEPENDENT_AMBULATORY_CARE_PROVIDER_SITE_OTHER): Payer: 59 | Admitting: Sports Medicine

## 2015-12-09 VITALS — BP 140/80 | HR 89 | Temp 97.8°F | Ht 71.0 in | Wt 233.0 lb

## 2015-12-09 DIAGNOSIS — R0789 Other chest pain: Secondary | ICD-10-CM | POA: Diagnosis not present

## 2015-12-09 DIAGNOSIS — R079 Chest pain, unspecified: Secondary | ICD-10-CM | POA: Diagnosis not present

## 2015-12-09 LAB — LIPID PANEL
Cholesterol: 242 mg/dL — ABNORMAL HIGH (ref 125–200)
HDL: 41 mg/dL (ref >=40)
Total CHOL/HDL Ratio: 5.9 Ratio — ABNORMAL HIGH (ref <=5.0)
Triglycerides: 1239 mg/dL — ABNORMAL HIGH (ref <150)

## 2015-12-09 LAB — COMPREHENSIVE METABOLIC PANEL WITH GFR
AST: 41 U/L — ABNORMAL HIGH (ref 10–40)
Albumin: 5.2 g/dL — ABNORMAL HIGH (ref 3.6–5.1)
Alkaline Phosphatase: 89 U/L (ref 40–115)
Calcium: 10.5 mg/dL — ABNORMAL HIGH (ref 8.6–10.3)
Creat: 1.29 mg/dL (ref 0.60–1.35)
Sodium: 132 mmol/L — ABNORMAL LOW (ref 135–146)
Total Bilirubin: 1.1 mg/dL (ref 0.2–1.2)
Total Protein: 8.2 g/dL — ABNORMAL HIGH (ref 6.1–8.1)

## 2015-12-09 LAB — COMPREHENSIVE METABOLIC PANEL
ALT: 81 U/L — ABNORMAL HIGH (ref 9–46)
BUN: 19 mg/dL (ref 7–25)
CO2: 26 mmol/L (ref 20–31)
Chloride: 99 mmol/L (ref 98–110)
Glucose, Bld: 102 mg/dL — ABNORMAL HIGH (ref 65–99)
Potassium: 4.8 mmol/L (ref 3.5–5.3)

## 2015-12-09 LAB — CBC WITH DIFFERENTIAL/PLATELET
Basophils Absolute: 0 K/uL (ref 0.0–0.1)
Basophils Relative: 0 % (ref 0–1)
Eosinophils Absolute: 0.1 K/uL (ref 0.0–0.7)
Eosinophils Relative: 1 % (ref 0–5)
HCT: 48.1 % (ref 39.0–52.0)
Hemoglobin: 17.3 g/dL — ABNORMAL HIGH (ref 13.0–17.0)
Lymphocytes Relative: 33 % (ref 12–46)
Lymphs Abs: 2.5 10*3/uL (ref 0.7–4.0)
MCH: 29.1 pg (ref 26.0–34.0)
MCHC: 36 g/dL (ref 30.0–36.0)
MCV: 81 fL (ref 78.0–100.0)
MPV: 10.5 fL (ref 8.6–12.4)
Monocytes Absolute: 0.5 10*3/uL (ref 0.1–1.0)
Monocytes Relative: 6 % (ref 3–12)
Neutro Abs: 4.6 10*3/uL (ref 1.7–7.7)
Neutrophils Relative %: 60 % (ref 43–77)
Platelets: 255 10*3/uL (ref 150–400)
RBC: 5.94 MIL/uL — ABNORMAL HIGH (ref 4.22–5.81)
RDW: 15 % (ref 11.5–15.5)
WBC: 7.7 10*3/uL (ref 4.0–10.5)

## 2015-12-09 LAB — HEMOGLOBIN A1C
Hgb A1c MFr Bld: 5.9 % — ABNORMAL HIGH (ref <5.7)
Mean Plasma Glucose: 123 mg/dL

## 2015-12-09 LAB — LIPASE: Lipase: 42 U/L (ref 7–60)

## 2015-12-09 LAB — CK TOTAL AND CKMB (NOT AT ARMC)
CK, MB: 1.9 ng/mL (ref 0.0–5.0)
Relative Index: 1.1 (ref 0.0–4.0)
Total CK: 170 U/L (ref 7–232)

## 2015-12-09 LAB — TROPONIN I: Troponin I: 0.01 ng/mL (ref <=0.05)

## 2015-12-09 LAB — AMYLASE: Amylase: 62 U/L (ref 0–105)

## 2015-12-09 LAB — TSH: TSH: 2.15 mIU/L (ref 0.40–4.50)

## 2015-12-09 NOTE — Progress Notes (Signed)
  Subjective:    CC: Chest pain, nausea  HPI: Over the past 3-4 hours has had worsening chest pain with deep breathing, nausea, no constitutional symptoms, no cough, upper respiratory symptoms, no abdominal pain. Took a Xanax which helped a bit, does have a history of panic attacks. No skin rashes. Mild vomiting. Principal symptom is pain in the chest with deep breathing. No diaphoresis  Past medical history, Surgical history, Family history not pertinant except as noted below, Social history, Allergies, and medications have been entered into the medical record, reviewed, and no changes needed.   Review of Systems: No fevers, chills, night sweats, weight loss, chest pain, or shortness of breath.   Objective:    General: Well Developed, well nourished, and in no acute distress.  Neuro: Alert and oriented x3, extra-ocular muscles intact, sensation grossly intact.  HEENT: Normocephalic, atraumatic, pupils equal round reactive to light, neck supple, no masses, no lymphadenopathy, thyroid nonpalpable.  Skin: Warm and dry, no rashes. Cardiac: Regular rate and rhythm, no murmurs rubs or gallops, no lower extremity edema.  Respiratory: Clear to auscultation bilaterally. Not using accessory muscles, speaking in full sentences. Abdomen: Soft, nontender, nondistended, no bowel sounds, no palpable masses, no guarding, rigidity, rebound tenderness, no flank pain.  EKG personally reviewed and shows normal sinus rhythm at a rate of 90 with normal axis, no overt ST changes.  Impression and Recommendations:

## 2015-12-09 NOTE — Assessment & Plan Note (Addendum)
Pleuritic, unclear etiology. Doing a bit of a shotgun workup. Also having some nausea, Phenergan given here in the office, EKG normal, low suspicion for acute coronary syndrome.  Hyponatremic and mild transaminitis.  Likely dehydrated.  Also elevated d-dimer, ordered stat but not resulted for over 1.5 days. We have attempted to call the patient several times without answer, we have even tried to contact his wife. I've ordered the CT angio at [facility] and patient needs to go STAT.

## 2015-12-10 LAB — D-DIMER, QUANTITATIVE: D-Dimer, Quant: 0.75 ug/mL-FEU — ABNORMAL HIGH (ref 0.00–0.48)

## 2015-12-11 NOTE — Addendum Note (Signed)
Addended by: Monica BectonHEKKEKANDAM, Klayton Monie J on: 12/11/2015 07:20 PM   Modules accepted: Orders

## 2015-12-13 ENCOUNTER — Telehealth: Payer: Self-pay | Admitting: *Deleted

## 2015-12-13 NOTE — Telephone Encounter (Signed)
Pt left vm requesting to speak to you about his visit last week with Dr. TKarie Schwalbe

## 2015-12-15 NOTE — Telephone Encounter (Signed)
LM to call back to discuss concerns.

## 2015-12-17 ENCOUNTER — Ambulatory Visit: Payer: 59 | Admitting: Physician Assistant

## 2016-01-04 ENCOUNTER — Ambulatory Visit (INDEPENDENT_AMBULATORY_CARE_PROVIDER_SITE_OTHER): Payer: Worker's Compensation

## 2016-01-04 ENCOUNTER — Encounter: Payer: Self-pay | Admitting: Physician Assistant

## 2016-01-04 ENCOUNTER — Ambulatory Visit (INDEPENDENT_AMBULATORY_CARE_PROVIDER_SITE_OTHER): Payer: Worker's Compensation | Admitting: Physician Assistant

## 2016-01-04 VITALS — BP 154/73 | HR 103 | Ht 71.0 in | Wt 240.0 lb

## 2016-01-04 DIAGNOSIS — S4991XA Unspecified injury of right shoulder and upper arm, initial encounter: Secondary | ICD-10-CM | POA: Diagnosis not present

## 2016-01-04 DIAGNOSIS — F0781 Postconcussional syndrome: Secondary | ICD-10-CM | POA: Diagnosis not present

## 2016-01-04 DIAGNOSIS — M898X1 Other specified disorders of bone, shoulder: Secondary | ICD-10-CM | POA: Diagnosis not present

## 2016-01-04 DIAGNOSIS — M25511 Pain in right shoulder: Secondary | ICD-10-CM

## 2016-01-04 MED ORDER — MELOXICAM 15 MG PO TABS: 15.0000 mg | ORAL_TABLET | Freq: Every day | ORAL | Status: DC

## 2016-01-04 NOTE — Progress Notes (Addendum)
   Subjective:    Patient ID: Juan ParsleyCody Mella, male    DOB: 09-19-1990, 25 y.o.   MRN: [id number]  HPI Patient is here today for a follow up after being seen at [facility] after an accident at work on 01/01/16. Patient was at work standing on a trailer when he fell backwards and hit his head on the trailer and right shoulder.His supervisor that was standing close by reported that it took several minutes to arouse the patient. He vomited twice shortly thereafter. He does not remember anything after that until he was at the hospital. Patient had Head CT, CXR with ribs, cervcal and lumbar spine xray [facility] which was normal. He also had a chest Xray which showed bruising/swelling, but no fractures. He was prescribed Flexeril, Zofran, and Oxycodone.  Today, he is still having headaches. He is very dizzy, but believes it is from the Oxycodone. Headache is relieved some with the Ibuprofen. He denies any blurry or double vision. He has been doing mental rest. He is out of work and states he is limiting his TV and computer time.  He is having a lot of right sided neck and shoulder pain that is worse with lying down. He states it is a "pinching" pain that makes it difficult to sleep. He does report some decreased range of motion. He is not getting any relief with the Flexeril. He is taking Ibuprofen and states that is relieving some of his pain.   Review of Systems  All other systems reviewed and are negative.      Objective:   Physical Exam  Constitutional: He appears well-developed and well-nourished.  HENT:  Head: Normocephalic and atraumatic.  Eyes: EOM are normal. Pupils are equal, round, and reactive to light.  Neck: Normal range of motion.  Cardiovascular: Normal rate, regular rhythm and normal heart sounds.   Pulmonary/Chest: Effort normal and breath sounds normal.  Musculoskeletal:       Right shoulder: He exhibits decreased range of motion (limited eversion of the right  shoulder), tenderness, bony tenderness and crepitus.       Arms:         Assessment & Plan:  1. Concussion- Patient reports continued occasional headaches that improve with Ibuprofen. Advised patient to continue brain rest with limited screen time. Wrote patient out of work until May 1st. Recommended slowly increasing screen time/ brain exertion 10 minutes at a time until he returns to his normal functioning.  If going back to work May 1st proves to be too much, will consider extending this time period. Follow up in 2-4 weeks or sooner if needed. I am sending Mobic for him to use for pain relief instead of Ibuprofen.   2. Right shoulder/scapula pain- Patient reports landing on his shoulder when he fell. On PE, he has decreased range of motion of the shoulder and tenderness to palpation of the scapula. I'm going to order an xray of the scapula/right shoulder. Discussed seeing a chiropractor vs physical therapy for shoulder pain and limited range of motion. Advised patient to ice shoulder regularly and take Mobic for pain relief.

## 2016-01-04 NOTE — Patient Instructions (Signed)
Post-Concussion Syndrome  Post-concussion syndrome describes the symptoms that can occur after a head injury. These symptoms can last from weeks to months.  CAUSES   It is not clear why some head injuries cause post-concussion syndrome. It can occur whether your head injury was mild or severe and whether you were wearing head protection or not.   SIGNS AND SYMPTOMS  · Memory difficulties.  · Dizziness.  · Headaches.  · Double vision or blurry vision.  · Sensitivity to light.  · Hearing difficulties.  · Depression.  · Tiredness.  · Weakness.  · Difficulty with concentration.  · Difficulty sleeping or staying asleep.  · Vomiting.  · Poor balance or instability on your feet.  · Slow reaction time.  · Difficulty learning and remembering things you have heard.  DIAGNOSIS   There is no test to determine whether you have post-concussion syndrome. Your health care provider may order an imaging scan of your brain, such as a CT scan, to check for other problems that may be causing your symptoms (such as a severe injury inside your skull).  TREATMENT   Usually, these problems disappear over time without medical care. Your health care provider may prescribe medicine to help ease your symptoms. It is important to follow up with a neurologist to evaluate your recovery and address any lingering symptoms or issues.  HOME CARE INSTRUCTIONS   · Take medicines only as directed by your health care provider. Do not take aspirin. Aspirin can slow blood clotting.  · Sleep with your head slightly elevated to help with headaches.  · Avoid any situation where there is potential for another head injury. This includes football, hockey, soccer, basketball, martial arts, downhill snow sports, and horseback riding. Your condition will get worse every time you experience a concussion. You should avoid these activities until you are evaluated by the appropriate follow-up health care providers.  · Keep all follow-up visits as directed by your health  care provider. This is important.  SEEK MEDICAL CARE IF:  · You have increased problems paying attention or concentrating.  · You have increased difficulty remembering or learning new information.  · You need more time to complete tasks or assignments than before.  · You have increased irritability or decreased ability to cope with stress.  · You have more symptoms than before.  Seek medical care if you have any of the following symptoms for more than two weeks after your injury:  · Lasting (chronic) headaches.  · Dizziness or balance problems.  · Nausea.  · Vision problems.  · Increased sensitivity to noise or light.  · Depression or mood swings.  · Anxiety or irritability.  · Memory problems.  · Difficulty concentrating or paying attention.  · Sleep problems.  · Feeling tired all the time.  SEEK IMMEDIATE MEDICAL CARE IF:  · You have confusion or unusual drowsiness.  · Others find it difficult to wake you up.  · You have nausea or persistent, forceful vomiting.  · You feel like you are moving when you are not (vertigo). Your eyes may move rapidly back and forth.  · You have convulsions or faint.  · You have severe, persistent headaches that are not relieved by medicine.  · You cannot use your arms or legs normally.  · One of your pupils is larger than the other.  · You have clear or bloody discharge from your nose or ears.  · Your problems are getting worse, not better.  MAKE   SURE YOU:  · Understand these instructions.  · Will watch your condition.  · Will get help right away if you are not doing well or get worse.     This information is not intended to replace advice given to you by your health care provider. Make sure you discuss any questions you have with your health care provider.     Document Released: 02/17/2002 Document Revised: 09/18/2014 Document Reviewed: 12/03/2013  Elsevier Interactive Patient Education ©2016 [employer]

## 2016-05-01 ENCOUNTER — Ambulatory Visit: Payer: 59 | Admitting: Physician Assistant

## 2016-05-03 ENCOUNTER — Encounter: Payer: Self-pay | Admitting: Physician Assistant

## 2016-05-03 ENCOUNTER — Telehealth: Payer: Self-pay | Admitting: Physician Assistant

## 2016-05-03 NOTE — Telephone Encounter (Signed)
Patient advised.

## 2016-05-03 NOTE — Telephone Encounter (Signed)
Start with Fish Oil 4000mg  daily. Follow up when can.

## 2016-05-03 NOTE — Telephone Encounter (Signed)
Patient called to see if PCP would write him a note for his job regarding a new face mask. Pt reports they have an option for a "helmet respirator mask" instead of the regular face mask. Pt reports the face mask is hard to breathe in and with his anxiety it almost sends him into panic attacks. The helmet respirator mask allows for more room and decreases the pressure he feels on his face. Pt works with a company [place] in [place] with [place], that is why the mask have to be worn daily. Will route to PCP for review.

## 2016-05-03 NOTE — Telephone Encounter (Signed)
I printed off letter for patient to pick up or fax to work.   TG are very elevated it needs to start prescription fish oil. We need a copy of labs so that we can get approved via insurance company. Please bring this into office and then we can start and discuss Vascepa to get TG down. Diet changes can also help with avoiding sugars and carbs and saturated fats. Daily exercise at a minimum of 150 minutes a week also encouraged.

## 2016-05-03 NOTE — Telephone Encounter (Signed)
Juan Andersen had his cholesterol checked at work. His triglycerides are 801 mg/dl. Please advise.

## 2016-05-03 NOTE — Telephone Encounter (Signed)
Patient advised of recommendations. He is without insurance at this time and would like to take on OTC fish oil. At least until he has insurance again. Please advise.

## 2016-05-09 ENCOUNTER — Other Ambulatory Visit: Payer: Self-pay | Admitting: Physician Assistant

## 2016-05-11 ENCOUNTER — Other Ambulatory Visit: Payer: Self-pay

## 2016-05-11 MED ORDER — ALPRAZOLAM 0.5 MG PO TABS: 0.5000 mg | ORAL_TABLET | Freq: Two times a day (BID) | ORAL | 0 refills | Status: DC | PRN

## 2016-06-01 ENCOUNTER — Telehealth: Payer: Self-pay

## 2016-06-01 ENCOUNTER — Other Ambulatory Visit: Payer: Self-pay | Admitting: Family Medicine

## 2016-06-01 NOTE — Telephone Encounter (Signed)
Juan Andersen states he has switched jobs and will not have insurance for 60 days. He wanted to know if he could get refills for xanax to last until he can get insurance. He will not need a refill until 06/09/2016.

## 2016-06-02 NOTE — Telephone Encounter (Signed)
Yes that is ok. Just post date the first one to start 06/09/16.

## 2016-06-05 MED ORDER — ALPRAZOLAM 0.5 MG PO TABS: 0.5000 mg | ORAL_TABLET | Freq: Two times a day (BID) | ORAL | 1 refills | Status: DC | PRN

## 2016-06-05 NOTE — Telephone Encounter (Signed)
Medication will be faxed and patient is aware.

## 2016-06-09 ENCOUNTER — Encounter: Payer: Self-pay | Admitting: Physician Assistant

## 2016-07-04 ENCOUNTER — Telehealth: Payer: Self-pay | Admitting: *Deleted

## 2016-07-04 NOTE — Telephone Encounter (Signed)
Pt left vm stating that he is "having a concern" & request you call him personally.

## 2016-07-05 NOTE — Telephone Encounter (Signed)
Called patient. They are rejecting letter for him wearing helmet. They requested "is required" to be in note. I looked and is in note. Will call back if having any more trouble.

## 2016-08-16 ENCOUNTER — Other Ambulatory Visit: Payer: Self-pay | Admitting: Physician Assistant

## 2016-10-05 ENCOUNTER — Other Ambulatory Visit: Payer: Self-pay | Admitting: Physician Assistant

## 2016-10-11 ENCOUNTER — Encounter: Payer: Self-pay | Admitting: Physician Assistant

## 2016-10-11 ENCOUNTER — Ambulatory Visit (INDEPENDENT_AMBULATORY_CARE_PROVIDER_SITE_OTHER): Payer: BLUE CROSS/BLUE SHIELD | Admitting: Physician Assistant

## 2016-10-11 VITALS — BP 142/87 | HR 67 | Ht 71.0 in | Wt 234.0 lb

## 2016-10-11 DIAGNOSIS — F329 Major depressive disorder, single episode, unspecified: Secondary | ICD-10-CM

## 2016-10-11 DIAGNOSIS — F410 Panic disorder [episodic paroxysmal anxiety] without agoraphobia: Secondary | ICD-10-CM | POA: Diagnosis not present

## 2016-10-11 DIAGNOSIS — F418 Other specified anxiety disorders: Secondary | ICD-10-CM

## 2016-10-11 DIAGNOSIS — F419 Anxiety disorder, unspecified: Principal | ICD-10-CM

## 2016-10-11 MED ORDER — SERTRALINE HCL 50 MG PO TABS: 50.0000 mg | ORAL_TABLET | Freq: Every day | ORAL | 1 refills | Status: DC

## 2016-10-11 MED ORDER — ALPRAZOLAM 0.5 MG PO TABS: 0.5000 mg | ORAL_TABLET | Freq: Two times a day (BID) | ORAL | 5 refills | Status: DC | PRN
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
## 2016-10-20 ENCOUNTER — Telehealth: Payer: Self-pay | Admitting: Physician Assistant

## 2016-10-20 NOTE — Telephone Encounter (Signed)
Letter has been completed

## 2016-10-20 NOTE — Telephone Encounter (Signed)
Pt states his mother in law was [facility] for flu yesterday (10/19/16). Pt states they wrote the whole family Rx's for Tamiflu. While at work last night, before getting home to start his Tamiflu, Pt started feeling sick. Since he was not seen at the ED, they will not write him a work note for last night. Questions if PCP can. Routing for review.

## 2016-10-20 NOTE — Telephone Encounter (Signed)
Ok to write work note for today and if he works the weekend. If still feeling like cannot go to work on Monday will need to be seen.

## 2016-11-22 ENCOUNTER — Ambulatory Visit (INDEPENDENT_AMBULATORY_CARE_PROVIDER_SITE_OTHER): Payer: BLUE CROSS/BLUE SHIELD | Admitting: Physician Assistant

## 2016-11-22 ENCOUNTER — Encounter: Payer: Self-pay | Admitting: Physician Assistant

## 2016-11-22 VITALS — BP 136/84 | HR 62 | Ht 71.0 in | Wt 227.0 lb

## 2016-11-22 DIAGNOSIS — F410 Panic disorder [episodic paroxysmal anxiety] without agoraphobia: Secondary | ICD-10-CM | POA: Diagnosis not present

## 2016-11-22 DIAGNOSIS — F329 Major depressive disorder, single episode, unspecified: Secondary | ICD-10-CM

## 2016-11-22 DIAGNOSIS — F418 Other specified anxiety disorders: Secondary | ICD-10-CM

## 2016-11-22 DIAGNOSIS — F419 Anxiety disorder, unspecified: Principal | ICD-10-CM

## 2016-11-22 MED ORDER — SERTRALINE HCL 100 MG PO TABS: 100.0000 mg | ORAL_TABLET | Freq: Every day | ORAL | 1 refills | Status: DC

## 2016-11-22 NOTE — Progress Notes (Signed)
   Subjective:    Patient ID: Juan ParsleyCody Safran, male    DOB: 08/10/1991, [age] y.o.   MRN: [id number]  HPI  Pt is a 26 yo male who presents to the clinic to follow up on anxiety/depression/panic attacks. He started zoloft 6 weeks ago. He denies any side effects. He states he has not had a panic attack since starting medication. He continues to use xanax at least once a day. He doesn't seem to worry as much and have obsessive thoughts. He is happy with results but does think he could get more benefit with a higher dose.      Review of Systems  All other systems reviewed and are negative.      Objective:   Physical Exam  Constitutional: He is oriented to person, place, and time. He appears well-developed and well-nourished.  HENT:  Head: Normocephalic and atraumatic.  Cardiovascular: Normal rate, regular rhythm and normal heart sounds.   Neurological: He is alert and oriented to person, place, and time.  Psychiatric: He has a normal mood and affect. His behavior is normal.          Assessment & Plan:  Marland Kitchen.Marland Kitchen.Diagnoses and all orders for this visit:  Anxiety and depression -     sertraline (ZOLOFT) 100 MG tablet; Take 1 tablet (100 mg total) by mouth daily.  Panic attacks -     sertraline (ZOLOFT) 100 MG tablet; Take 1 tablet (100 mg total) by mouth daily.   .. [facility]22/2016  Decreased Interest 0  Down, Depressed, Hopeless 1  PHQ - 2 Score 1   Numbers have greatly improved.  Increased zoloft to hopefully decrease xanax use to as needed not every day.  Follow up in 6 months.

## 2016-12-07 ENCOUNTER — Other Ambulatory Visit: Payer: Self-pay | Admitting: Physician Assistant

## 2016-12-07 DIAGNOSIS — F419 Anxiety disorder, unspecified: Principal | ICD-10-CM

## 2016-12-07 DIAGNOSIS — F329 Major depressive disorder, single episode, unspecified: Secondary | ICD-10-CM

## 2016-12-07 DIAGNOSIS — F410 Panic disorder [episodic paroxysmal anxiety] without agoraphobia: Secondary | ICD-10-CM

## 2016-12-29 ENCOUNTER — Encounter: Payer: Self-pay | Admitting: Physician Assistant

## 2016-12-29 ENCOUNTER — Ambulatory Visit (INDEPENDENT_AMBULATORY_CARE_PROVIDER_SITE_OTHER): Payer: BLUE CROSS/BLUE SHIELD | Admitting: Physician Assistant

## 2016-12-29 VITALS — BP 127/85 | HR 89 | Ht 71.0 in | Wt 231.0 lb

## 2016-12-29 DIAGNOSIS — R55 Syncope and collapse: Secondary | ICD-10-CM | POA: Diagnosis not present

## 2016-12-29 NOTE — Patient Instructions (Signed)
Will make referral to neurologist.

## 2016-12-29 NOTE — Progress Notes (Signed)
   Subjective:    Patient ID: Juan Andersen, male    DOB: [birth year], 26 y.o.   MRN: [id number]  HPI  Pt is a 26 yo male who presents to the clinic for hospital follow up from syncope and collapse on 12/19/16. This has happened once before in September of last year. He remembers getting ready for work and then starting to feel hot and flushed. Next thing he knew he fell backwards and head hit sheet rock. Wife states he was out for about 30 seconds. He did not urinate on himself this time but did back in September. wift reports while out he was shaking and very cold. CT normal with blood work at hospital. Denies any CP, SOB. Neither episode was with exertion. No family hx of seizures. He was evaluated by cardiologist about 10 years ago with all normal results.    Review of Systems  All other systems reviewed and are negative.      Objective:   Physical Exam  Constitutional: He is oriented to person, place, and time. He appears well-developed and well-nourished.  HENT:  Head: Normocephalic and atraumatic.  Cardiovascular: Normal rate, regular rhythm and normal heart sounds.   Pulmonary/Chest: Effort normal and breath sounds normal.  Neurological: He is alert and oriented to person, place, and time. He has normal reflexes. No cranial nerve deficit. Coordination normal.  Psychiatric: He has a normal mood and affect. His behavior is normal.          Assessment & Plan:  Marland KitchenMarland KitchenVenson was seen today for hospitalization follow-up.  Diagnoses and all orders for this visit:  Syncope and collapse -     Ambulatory referral to Neurology   Unclear etiology. Since happened twice and at least once incontinence was reported will send to neurology. Could be a vasovagal syndrome but unclear of trigger.

## 2017-01-31 ENCOUNTER — Ambulatory Visit (INDEPENDENT_AMBULATORY_CARE_PROVIDER_SITE_OTHER): Payer: BLUE CROSS/BLUE SHIELD | Admitting: Neurology

## 2017-01-31 ENCOUNTER — Encounter: Payer: Self-pay | Admitting: Neurology

## 2017-01-31 ENCOUNTER — Encounter (INDEPENDENT_AMBULATORY_CARE_PROVIDER_SITE_OTHER): Payer: Self-pay

## 2017-01-31 VITALS — BP 128/82 | HR 85 | Ht 71.0 in | Wt 227.0 lb

## 2017-01-31 DIAGNOSIS — R55 Syncope and collapse: Secondary | ICD-10-CM | POA: Diagnosis not present
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
## 2017-01-31 NOTE — Patient Instructions (Signed)
Remember to drink plenty of fluid, eat healthy meals and do not skip any meals. Try to eat protein with a every meal and eat a healthy snack such as fruit or nuts in between meals. Try to keep a regular sleep-wake schedule and try to exercise daily, particularly in the form of walking, 20-30 minutes a day, if you can.  As far as diagnostic testing: EEG  I would like to see you back in 3 months, sooner if we need to. Please call us with any interim questions, concerns, problems, updates or refill requests.   Our phone number is [phone]. We also have an after hours call service for urgent matters and there is a physician on-call for urgent questions. For any emergencies you know to call 911 or go to the nearest emergency room   Epilepsy Epilepsy is when a person keeps having seizures. A seizure is unusual activity in the brain. A seizure can change how you think or behave, and it can make it hard to be aware of what is happening. This condition can cause problems, such as:  Falls, accidents, and injury.  Depression.  Poor memory.  Sudden unexplained death in epilepsy (SUDEP). This is rare. Its cause is not known. Most people with epilepsy lead normal lives. Follow these instructions at home: Medicines    Take medicines only as told by your doctor.  Avoid anything that may keep your medicine from working, such as alcohol. Activity   Get enough rest. Lack of sleep can make seizures more likely to occur.  Follow your doctor's advice about driving, swimming, and doing anything else that would be dangerous if you had a seizure. Teaching others  Teach friends and family what to do if you have a seizure. They should:  Lay you on the ground to prevent a fall.  Cushion your head and body.  Loosen any tight clothing around your neck.  Turn you on your side.  Stay with you until you are better.  Not hold you down.  Not put anything in your mouth.  Know whether or not you need  emergency care. General instructions   Avoid anything that causes you to have seizures.  Keep a seizure diary. Write down what you remember about each seizure, and especially what might have caused it.  Keep all follow-up visits as told by your doctor. This is important. Contact a doctor if:  You have a change in your seizure pattern.  You get an infection or start to feel sick. You may have more seizures when you are sick. Get help right away if:  A seizure does not stop after 5 minutes.  You have more than one seizure in a row, and you do not have enough time between the seizures to feel better.  A seizure makes it harder to breathe.  A seizure is different from other seizures you have had.  A seizure makes you unable to speak or use a part of your body.  You did not wake up right after a seizure. This information is not intended to replace advice given to you by your health care provider. Make sure you discuss any questions you have with your health care provider. Document Released: 06/25/2009 Document Revised: 04/03/2016 Document Reviewed: 03/07/2016 Elsevier Interactive Patient Education  2017 [employer]

## 2017-02-01 ENCOUNTER — Ambulatory Visit (INDEPENDENT_AMBULATORY_CARE_PROVIDER_SITE_OTHER): Payer: BLUE CROSS/BLUE SHIELD | Admitting: Neurology

## 2017-02-01 ENCOUNTER — Encounter: Payer: Self-pay | Admitting: Neurology

## 2017-02-01 DIAGNOSIS — R55 Syncope and collapse: Secondary | ICD-10-CM | POA: Diagnosis not present

## 2017-02-01 NOTE — Progress Notes (Signed)
Please refer to EEG procedure note. 

## 2017-02-01 NOTE — Procedures (Signed)
    History:  Juan ParsleyCody Andersen is a 26 year old gentleman with a past history of migraine headaches and hypertension as well as anxiety and panic attacks. The patient is being evaluated for recurring episodes of syncope. The first episode occurred in September 2017 when the patient was hot and dehydrated. The patient was unconscious for about 5 minutes with trembling and foaming at the mouth. The patient had another event on 12/19/2016. He had some nausea and dizziness and then loss consciousness.  This is a routine EEG. No skull defects are noted. Medications include Xanax, lisinopril, Mobic, and Zoloft.   EEG classification: Normal awake  Description of the recording: The background rhythms of this recording consists of a fairly well modulated medium amplitude alpha rhythm of 10 Hz that is reactive to eye opening and closure. As the record progresses, the patient appears to remain in the waking state throughout the recording. Photic stimulation was performed, resulting in a bilateral and symmetric photic driving response. Hyperventilation was also performed, resulting in a minimal buildup of the background rhythm activities without significant slowing seen. At no time during the recording does there appear to be evidence of spike or spike wave discharges or evidence of focal slowing. EKG monitor shows no evidence of cardiac rhythm abnormalities with a heart rate of 72.  Impression: This is a normal EEG recording in the waking state. No evidence of ictal or interictal discharges are seen.

## 2017-02-08 ENCOUNTER — Telehealth: Payer: Self-pay

## 2017-02-08 NOTE — Telephone Encounter (Signed)
Rn call patient but he was resting. PT works third shift. Pts wife is on the dpr. Rn stated the EEG was normal. Pts wife verbalized understanding. Rn stated Dr.Ahern would like a three day EEG.PTs wife stated they would like the 3 day EEG test. Rn stated an application will be sent to [facility] and they will contact her for the process. PTs wife verbalized understanding and will tell the pt.

## 2017-02-08 NOTE — Telephone Encounter (Signed)
Referral and application sent via fax to  [phone].

## 2017-02-08 NOTE — Telephone Encounter (Signed)
-----   Message from Anson FretAntonia B Ahern, MD sent at 02/01/2017  1:32 PM EDT ----- Normal eeg I would like an extended eeg at home is he willing? If so please fill out form for [facility] thank you!

## 2017-05-03 ENCOUNTER — Ambulatory Visit: Payer: BLUE CROSS/BLUE SHIELD | Admitting: Adult Health

## 2017-05-09 ENCOUNTER — Ambulatory Visit (INDEPENDENT_AMBULATORY_CARE_PROVIDER_SITE_OTHER): Payer: BLUE CROSS/BLUE SHIELD | Admitting: Physician Assistant

## 2017-05-09 ENCOUNTER — Encounter: Payer: Self-pay | Admitting: Physician Assistant

## 2017-05-09 VITALS — BP 128/88 | HR 99 | Temp 98.1°F | Ht 71.0 in | Wt 238.0 lb

## 2017-05-09 DIAGNOSIS — L240 Irritant contact dermatitis due to detergents: Secondary | ICD-10-CM

## 2017-05-09 DIAGNOSIS — I10 Essential (primary) hypertension: Secondary | ICD-10-CM

## 2017-05-09 DIAGNOSIS — Z77011 Contact with and (suspected) exposure to lead: Secondary | ICD-10-CM | POA: Diagnosis not present

## 2017-05-09 DIAGNOSIS — R7871 Abnormal lead level in blood: Secondary | ICD-10-CM

## 2017-05-09 MED ORDER — CLOBETASOL PROPIONATE 0.05 % EX CREA: TOPICAL_CREAM | CUTANEOUS | 0 refills | Status: DC

## 2017-05-09 MED ORDER — LISINOPRIL 20 MG PO TABS: 20.0000 mg | ORAL_TABLET | Freq: Every day | ORAL | 0 refills | Status: DC

## 2017-05-09 NOTE — Progress Notes (Signed)
HPI:                                                                Juan Andersen is a 26 y.o. 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 States he had to use "de-leading" soap, which he is allergic to. He also wears cotton gloves daily Onset: 1.5 weeks ago Location: started on ankles, has spread to hands/wrists Duration: constant Character: started out pruritic, now burning and weeping blisters Aggravating factors / Triggers: hand washing Evolution: blistering, swelling Treatments tried: calamine lotion, hydrocortisone cream No systemic symptoms  HTN: not currently taking Lisinopril prescribed by his PCP. Denies vision change, headache, chest pain with exertion, orthopnea, lightheadedness, syncope and edema. Risk factors include: male sex, obesity  Patient also inquires whether he should start OTC lead lowering medications.  Past Medical History:  Diagnosis Date  . Anxiety   . Hypertension   . Migraine    Past Surgical History:  Procedure Laterality Date  . MYRINGOTOMY     Social History  Substance Use Topics  . Smoking status: Never Smoker  . Smokeless tobacco: Never Used  . Alcohol use No   family history includes Alcohol abuse in his maternal uncle and paternal uncle; Cancer in his maternal grandfather; Depression in his maternal uncle; Diabetes in his maternal grandmother, paternal aunt, and paternal grandfather; Heart attack in his paternal grandfather; Hyperlipidemia in his father, paternal aunt, paternal grandfather, and paternal uncle; Hypertension in his father; Stroke in his maternal grandmother.  ROS: negative except as noted in the HPI  Medications: Current Outpatient Prescriptions  Medication Sig Dispense Refill  . ALPRAZolam (XANAX) 0.5 MG tablet Take 1 tablet (0.5 mg total) by mouth 2 (two) times daily as needed. for anxiety. 60 tablet 5  .  clobetasol cream (TEMOVATE) 0.05 % 1 application to affected areas twice daily for 2 weeks 60 g 0  . lisinopril (PRINIVIL,ZESTRIL) 20 MG tablet Take 1 tablet (20 mg total) by mouth daily. 30 tablet 0  . sertraline (ZOLOFT) 100 MG tablet Take 1 tablet (100 mg total) by mouth daily. 90 tablet 1   No current facility-administered medications for this visit.    Allergies  Allergen Reactions  . Benadryl [Diphenhydramine Hcl (Sleep)]        Objective:  BP 128/88   Pulse 99   Temp 98.1 F (36.7 C) (Oral)   Ht 5\' 11"  (1.803 m)   Wt 238 lb (108 kg)   BMI 33.19 kg/m  Gen:  alert, not ill-appearing, no distress, appropriate for age HEENT: head normocephalic without obvious abnormality, conjunctiva and cornea clear, trachea midline Pulm: Normal work of breathing, normal phonation Neuro: alert and oriented x 3, no tremor MSK: extremities atraumatic, normal gait and station Skin: bilateral wrists and dorsal hands with erythematous vesicular eruption without evidence of secondary infection Psych: well-groomed, cooperative, good eye contact, euthymic mood, affect mood-congruent, speech is articulate, and thought processes clear and goal-directed   Depression screen [facility] 2/9 05/09/2017 11/22/2016  Decreased Interest 2 0  Down, Depressed, Hopeless 1 1  PHQ - 2 Score 3 1  Altered sleeping 0 -  Tired, decreased energy 3 -  Change in appetite  0 -  Feeling bad or failure about yourself  0 -  Trouble concentrating 0 -  Moving slowly or fidgety/restless 0 -  Suicidal thoughts 0 -  PHQ-9 Score 6 -    No results found for this or any previous visit (from the past 72 hour(s)). No results found.    Assessment and Plan: 26 y.o. male with   1. Irritant contact dermatitis due to detergent - no evidence of secondary infection - avoid triggers - topical steroid bid x 2 weeks - clobetasol cream (TEMOVATE) 0.05 %; 1 application to affected areas twice daily for 2 weeks  Dispense: 60 g; Refill:  0  2. Essential hypertension, benign BP Readings from Last 3 Encounters:  05/09/17 128/88  01/31/17 128/82  12/29/16 127/85  - recheck BP reading improved, but diastolic pressure still consistently elevated.  - restarting ACE. Follow-up with PCP in 2 weeks - lisinopril (PRINIVIL,ZESTRIL) 20 MG tablet; Take 1 tablet (20 mg total) by mouth daily.  Dispense: 30 tablet; Refill: 0  3. Lead exposure - occupational. Serum lead level checked at his employer  4. Elevated blood lead level - per patient level is 15. Since below 40 and asymptomatic, no indication for chelating therapy at this time   Patient education and anticipatory guidance given Patient agrees with treatment plan Follow-up in 2 weeks or sooner as needed if symptoms worsen or fail to improve  Levonne Hubertharley E. Laddie Naeem PA-C

## 2017-05-09 NOTE — Patient Instructions (Addendum)
- steroid cream twice a day to affected areas for 2 weeks. AVOID CONTACT WITH FACE  For your blood pressure: - Start Lisinopril 20mg  daily - Check blood pressure at home for the next 2 weeks - Check around the same time each day in a relaxed setting - Limit salt to <1500 mg/day - Follow DASH eating plan - limit alcohol to 2 standard drinks per day - avoid tobacco products - weight loss: 7% of current body weight - Follow-up in 2 weeks with Juan Andersen   Contact Dermatitis Dermatitis is redness, soreness, and swelling (inflammation) of the skin. Contact dermatitis is a reaction to certain substances that touch the skin. There are two types of contact dermatitis:  Irritant contact dermatitis. This type is caused by something that irritates your skin, such as dry hands from washing them too much. This type does not require previous exposure to the substance for a reaction to occur. This type is more common.  Allergic contact dermatitis. This type is caused by a substance that you are allergic to, such as a nickel allergy or poison ivy. This type only occurs if you have been exposed to the substance (allergen) before. Upon a repeat exposure, your body reacts to the substance. This type is less common.  What are the causes? Many different substances can cause contact dermatitis. Irritant contact dermatitis is most commonly caused by exposure to:  Makeup.  Soaps.  Detergents.  Bleaches.  Acids.  Metal salts, such as nickel.  Allergic contact dermatitis is most commonly caused by exposure to:  Poisonous plants.  Chemicals.  Jewelry.  Latex.  Medicines.  Preservatives in products, such as clothing.  What increases the risk? This condition is more likely to develop in:  People who have jobs that expose them to irritants or allergens.  People who have certain medical conditions, such as asthma or eczema.  What are the signs or symptoms? Symptoms of this condition may occur  anywhere on your body where the irritant has touched you or is touched by you. Symptoms include:  Dryness or flaking.  Redness.  Cracks.  Itching.  Pain or a burning feeling.  Blisters.  Drainage of small amounts of blood or clear fluid from skin cracks.  With allergic contact dermatitis, there may also be swelling in areas such as the eyelids, mouth, or genitals. How is this diagnosed? This condition is diagnosed with a medical history and physical exam. A patch skin test may be performed to help determine the cause. If the condition is related to your job, you may need to see an occupational medicine specialist. How is this treated? Treatment for this condition includes figuring out what caused the reaction and protecting your skin from further contact. Treatment may also include:  Steroid creams or ointments. Oral steroid medicines may be needed in more severe cases.  Antibiotics or antibacterial ointments, if a skin infection is present.  Antihistamine lotion or an antihistamine taken by mouth to ease itching.  A bandage (dressing).  Follow these instructions at home: Skin Care  Moisturize your skin as needed.  Apply cool compresses to the affected areas.  Try taking a bath with: ? Epsom salts. Follow the instructions on the packaging. You can get these at your local pharmacy or grocery store. ? Baking soda. Pour a small amount into the bath as directed by your health care provider. ? Colloidal oatmeal. Follow the instructions on the packaging. You can get this at your local pharmacy or grocery store.  Try applying baking soda paste to your skin. Stir water into baking soda until it reaches a paste-like consistency.  Do not scratch your skin.  Bathe less frequently, such as every other day.  Bathe in lukewarm water. Avoid using hot water. Medicines  Take or apply over-the-counter and prescription medicines only as told by your health care provider.  If you  were prescribed an antibiotic medicine, take or apply your antibiotic as told by your health care provider. Do not stop using the antibiotic even if your condition starts to improve. General instructions  Keep all follow-up visits as told by your health care provider. This is important.  Avoid the substance that caused your reaction. If you do not know what caused it, keep a journal to try to track what caused it. Write down: ? What you eat. ? What cosmetic products you use. ? What you drink. ? What you wear in the affected area. This includes jewelry.  If you were given a dressing, take care of it as told by your health care provider. This includes when to change and remove it. Contact a health care provider if:  Your condition does not improve with treatment.  Your condition gets worse.  You have signs of infection such as swelling, tenderness, redness, soreness, or warmth in the affected area.  You have a fever.  You have new symptoms. Get help right away if:  You have a severe headache, neck pain, or neck stiffness.  You vomit.  You feel very sleepy.  You notice red streaks coming from the affected area.  Your bone or joint underneath the affected area becomes painful after the skin has healed.  The affected area turns darker.  You have difficulty breathing. This information is not intended to replace advice given to you by your health care provider. Make sure you discuss any questions you have with your health care provider. Document Released: 08/25/2000 Document Revised: 02/03/2016 Document Reviewed: 01/13/2015 Elsevier Interactive Patient Education  2018 [employer].

## 2017-05-19 ENCOUNTER — Other Ambulatory Visit: Payer: Self-pay | Admitting: Physician Assistant

## 2017-05-19 DIAGNOSIS — F410 Panic disorder [episodic paroxysmal anxiety] without agoraphobia: Secondary | ICD-10-CM

## 2017-05-19 DIAGNOSIS — F419 Anxiety disorder, unspecified: Principal | ICD-10-CM

## 2017-05-19 DIAGNOSIS — F329 Major depressive disorder, single episode, unspecified: Secondary | ICD-10-CM

## 2017-05-22 ENCOUNTER — Other Ambulatory Visit: Payer: Self-pay | Admitting: Physician Assistant

## 2017-05-22 DIAGNOSIS — F329 Major depressive disorder, single episode, unspecified: Secondary | ICD-10-CM

## 2017-05-22 DIAGNOSIS — F419 Anxiety disorder, unspecified: Principal | ICD-10-CM

## 2017-05-22 DIAGNOSIS — F410 Panic disorder [episodic paroxysmal anxiety] without agoraphobia: Secondary | ICD-10-CM

## 2017-06-10 ENCOUNTER — Other Ambulatory Visit: Payer: Self-pay | Admitting: Physician Assistant

## 2017-06-10 DIAGNOSIS — I10 Essential (primary) hypertension: Secondary | ICD-10-CM

## 2017-06-23 ENCOUNTER — Other Ambulatory Visit: Payer: Self-pay | Admitting: Physician Assistant

## 2017-06-23 DIAGNOSIS — F410 Panic disorder [episodic paroxysmal anxiety] without agoraphobia: Secondary | ICD-10-CM

## 2017-06-23 DIAGNOSIS — F419 Anxiety disorder, unspecified: Principal | ICD-10-CM

## 2017-06-23 DIAGNOSIS — F329 Major depressive disorder, single episode, unspecified: Secondary | ICD-10-CM

## 2017-08-27 ENCOUNTER — Other Ambulatory Visit: Payer: Self-pay | Admitting: Physician Assistant

## 2017-08-27 DIAGNOSIS — F329 Major depressive disorder, single episode, unspecified: Secondary | ICD-10-CM

## 2017-08-27 DIAGNOSIS — I10 Essential (primary) hypertension: Secondary | ICD-10-CM

## 2017-08-27 DIAGNOSIS — F410 Panic disorder [episodic paroxysmal anxiety] without agoraphobia: Secondary | ICD-10-CM

## 2017-08-27 DIAGNOSIS — F419 Anxiety disorder, unspecified: Secondary | ICD-10-CM

## 2017-08-29 ENCOUNTER — Encounter: Payer: Self-pay | Admitting: Physician Assistant

## 2017-08-29 ENCOUNTER — Ambulatory Visit (INDEPENDENT_AMBULATORY_CARE_PROVIDER_SITE_OTHER): Payer: BLUE CROSS/BLUE SHIELD | Admitting: Physician Assistant

## 2017-08-29 VITALS — BP 135/73 | HR 99 | Ht 71.0 in | Wt 236.0 lb

## 2017-08-29 DIAGNOSIS — F329 Major depressive disorder, single episode, unspecified: Secondary | ICD-10-CM

## 2017-08-29 DIAGNOSIS — F410 Panic disorder [episodic paroxysmal anxiety] without agoraphobia: Secondary | ICD-10-CM

## 2017-08-29 DIAGNOSIS — E781 Pure hyperglyceridemia: Secondary | ICD-10-CM | POA: Diagnosis not present

## 2017-08-29 DIAGNOSIS — F419 Anxiety disorder, unspecified: Secondary | ICD-10-CM | POA: Diagnosis not present

## 2017-08-29 DIAGNOSIS — E782 Mixed hyperlipidemia: Secondary | ICD-10-CM | POA: Diagnosis not present

## 2017-08-29 DIAGNOSIS — I10 Essential (primary) hypertension: Secondary | ICD-10-CM

## 2017-08-29 DIAGNOSIS — R7301 Impaired fasting glucose: Secondary | ICD-10-CM

## 2017-08-29 MED ORDER — LISINOPRIL 20 MG PO TABS: 20.0000 mg | ORAL_TABLET | Freq: Every day | ORAL | 1 refills | Status: DC

## 2017-08-29 MED ORDER — SERTRALINE HCL 100 MG PO TABS: 100.0000 mg | ORAL_TABLET | Freq: Every day | ORAL | 1 refills | Status: DC

## 2017-08-29 MED ORDER — ALPRAZOLAM 0.5 MG PO TABS: ORAL_TABLET | ORAL | 5 refills | Status: DC

## 2017-08-29 MED ORDER — ICOSAPENT ETHYL 1 G PO CAPS: 2.0000 | ORAL_CAPSULE | Freq: Two times a day (BID) | ORAL | 11 refills | Status: DC

## 2017-08-29 NOTE — Progress Notes (Addendum)
Subjective:    Patient ID: Juan Andersen, male    DOB: Sep 27, 1990, 26 y.o.   MRN: [id number]  HPI Patient is 26 y/o male with PMH of anxiety, panic attacks, depression, and HTN who presents for follow-up.  Patient reports having sinus congestion and rhinorrhea for the last two days but thinks his symptoms are improving.   Previous syncopal episode - Patient denies recent syncopal episode. His 24 hr EEG was normal but they wanted him to do a 3 day EEG to follow-up. Patient states that he would not be able to do the 3 day with his line of work. Denies dizziness, lightheadedness.  Anxiety/Depression - Patient reports his mood is great on the zoloft, but states that he is still irritable. He does not sleep well because he works 3rd shift and his house is loud during the day. He uses xanax twice a day. He reports trying to only use it once daily but the last week has been bad for his anxiety. PHQ = 5, GAD = 9  HTN - BP is well controled on lisinopril. Today it is 135/73 mmHg.  Hyperlipidemia - Previous labs on 12/08/16 showed total cholesterol elevated at 242, triglycerides elevated at 1239. Patient admits to an unhealthy diet high in fat and sugar.  .. Active Ambulatory Problems    Diagnosis Date Noted  . Irregular heart beat 08/18/2013  . Panic attacks 08/18/2013  . Anxiety and depression 08/18/2013  . Essential hypertension, benign 08/31/2015  . Chest pain 12/09/2015  . Post concussion syndrome 01/04/2016  . Right shoulder injury 01/04/2016  . Pain of right scapula 01/04/2016  . Syncope and collapse 12/29/2016  . 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 Elevated blood lead level 05/09/2017   Resolved Ambulatory Problems    Diagnosis Date Noted  . No Resolved Ambulatory Problems   Past Medical History:  Diagnosis Date  . Anxiety   . Hypertension   . Migraine      Review of Systems See HPI, all other review of systems was negative.    Objective:   Physical Exam  Constitutional: He is  oriented to person, place, and time. He appears well-developed and well-nourished.  HENT:  Head: Normocephalic and atraumatic.  Right Ear: External ear normal.  Left Ear: External ear normal.  Nose: Nose normal.  Mouth/Throat: Oropharynx is clear and moist.  Neck: No thyromegaly present.  Cardiovascular: Normal rate, regular rhythm and normal heart sounds.  Pulmonary/Chest: Effort normal and breath sounds normal.  Lymphadenopathy:    He has no cervical adenopathy.  Neurological: He is alert and oriented to person, place, and time.  Skin: Skin is warm and dry.  Psychiatric: He has a normal mood and affect. His behavior is normal. Thought content normal.  Vitals reviewed.     Assessment & Plan:  .Marland Kitchen.Marland Kitchen.Juan Andersen was seen today for anxiety, depression and hypertension.  Diagnoses and all orders for this visit:  Mixed hyperlipidemia -     Lipid Panel w/reflex Direct LDL  Anxiety and depression -     ALPRAZolam (XANAX) 0.5 MG tablet; TAKE 1 TABLET BY MOUTH TWICE A DAY AS NEEDED FOR ANXIETY -     sertraline (ZOLOFT) 100 MG tablet; Take 1 tablet (100 mg total) by mouth daily.  Panic attacks -     ALPRAZolam (XANAX) 0.5 MG tablet; TAKE 1 TABLET BY MOUTH TWICE A DAY AS NEEDED FOR ANXIETY -     sertraline (ZOLOFT) 100 MG tablet; Take 1 tablet (100 mg total) by mouth  daily.  Essential hypertension, benign -     lisinopril (PRINIVIL,ZESTRIL) 20 MG tablet; Take 1 tablet (20 mg total) by mouth daily. -     COMPLETE METABOLIC PANEL WITH GFR  Elevated fasting glucose -     Hemoglobin A1c  Hypertriglyceridemia -     Icosapent Ethyl 1 g CAPS; Take 2 tablets by mouth 2 (two) times daily.  Anxiety/Depression/Panic attacks - Patients reports good mood on Zoloft with no medication side effects. His anxiety is well controlled on Xanax. PHQ9 = 5, GAD = 9. Continue as prescribed. - Encouraged patient to limit Xanax use and discussed that this medication can be addictive.  Hypertriglyceridemia -  Patients triglycerides are elevated at 1000. Vascepa twice daily - Advised patient to eat a low in saturated fat such as processed food, sugars and carbs. Advised him to eat more fresh foods. - Will repeat lipid panel in 3 months after he has been on the Vascepa  HTN - Patients BP is well controlled on lisinopril, continue as prescribed.  .. [facility] 08/29/2017 05/09/2017 11/22/2016  Decreased Interest 1 2 0  Down, Depressed, Hopeless 1 1 1   PHQ - 2 Score 2 3 1   Altered sleeping 1 0 -  Tired, decreased energy 2 3 -  Change in appetite 0 0 -  Feeling bad or failure about yourself  0 0 -  Trouble concentrating 0 0 -  Moving slowly or fidgety/restless 0 0 -  Suicidal thoughts 0 0 -  PHQ-9 Score 5 6 -  Difficult doing work/chores - Somewhat difficult -   .. GAD 7 : Generalized Anxiety Score 08/29/2017  Nervous, Anxious, on Edge 3  Control/stop worrying 1  Worry too much - different things 1  Trouble relaxing 0  Restless 1  Easily annoyed or irritable 3  Afraid - awful might happen 0  Total GAD 7 Score 9

## 2017-10-05 ENCOUNTER — Telehealth: Payer: Self-pay | Admitting: *Deleted

## 2017-10-05 ENCOUNTER — Other Ambulatory Visit: Payer: Self-pay | Admitting: Physician Assistant

## 2017-10-05 MED ORDER — POLYMYXIN B-TRIMETHOPRIM 10000-0.1 UNIT/ML-% OP SOLN: 1.0000 [drp] | OPHTHALMIC | 0 refills | Status: DC

## 2017-10-05 NOTE — Telephone Encounter (Signed)
Sent to pharmacy 

## 2017-10-05 NOTE — Telephone Encounter (Signed)
Pt left vm wanting to know if you would send him something in for pink eye because he is unable to come in today.  Please advise.

## 2017-10-18 ENCOUNTER — Other Ambulatory Visit: Payer: Self-pay | Admitting: *Deleted

## 2017-10-18 DIAGNOSIS — E781 Pure hyperglyceridemia: Secondary | ICD-10-CM

## 2017-10-18 MED ORDER — ICOSAPENT ETHYL 1 G PO CAPS: 2.0000 | ORAL_CAPSULE | Freq: Two times a day (BID) | ORAL | 3 refills | Status: DC

## 2017-11-10 ENCOUNTER — Other Ambulatory Visit: Payer: Self-pay | Admitting: Physician Assistant

## 2017-11-10 DIAGNOSIS — F419 Anxiety disorder, unspecified: Principal | ICD-10-CM

## 2017-11-10 DIAGNOSIS — F32A Depression, unspecified: Secondary | ICD-10-CM

## 2017-11-10 DIAGNOSIS — F410 Panic disorder [episodic paroxysmal anxiety] without agoraphobia: Secondary | ICD-10-CM

## 2017-11-10 DIAGNOSIS — F329 Major depressive disorder, single episode, unspecified: Secondary | ICD-10-CM

## 2017-11-28 ENCOUNTER — Ambulatory Visit (INDEPENDENT_AMBULATORY_CARE_PROVIDER_SITE_OTHER): Payer: BLUE CROSS/BLUE SHIELD | Admitting: Physician Assistant

## 2017-11-28 ENCOUNTER — Encounter: Payer: Self-pay | Admitting: Physician Assistant

## 2017-11-28 VITALS — BP 134/85 | HR 84 | Temp 98.3°F | Wt 239.0 lb

## 2017-11-28 DIAGNOSIS — F329 Major depressive disorder, single episode, unspecified: Secondary | ICD-10-CM | POA: Diagnosis not present

## 2017-11-28 DIAGNOSIS — F410 Panic disorder [episodic paroxysmal anxiety] without agoraphobia: Secondary | ICD-10-CM | POA: Diagnosis not present

## 2017-11-28 DIAGNOSIS — F419 Anxiety disorder, unspecified: Secondary | ICD-10-CM | POA: Diagnosis not present

## 2017-11-28 DIAGNOSIS — J069 Acute upper respiratory infection, unspecified: Secondary | ICD-10-CM | POA: Diagnosis not present

## 2017-11-28 LAB — POCT RAPID STREP A (OFFICE): RAPID STREP A SCREEN: NEGATIVE

## 2017-11-28 MED ORDER — SERTRALINE HCL 100 MG PO TABS: 100.0000 mg | ORAL_TABLET | Freq: Every day | ORAL | 0 refills | Status: DC

## 2017-11-28 NOTE — Patient Instructions (Addendum)
For anxiety: - start Zoloft, 1/2 tablet daily for 1 week, then increase to the full tablet (100 mg) daily - follow-up with [name] in 6 weeks. Schedule your appt today so that her schedule does not fill up  For sore throat: - alternative Tylenol 1000 mg every 8 hours with Ibuprofen 600 mg every 6 hours as needed for pain - Cepacol throat lozenges - Flonase for nasal congestion and post-nasal drip

## 2017-11-28 NOTE — Progress Notes (Signed)
HPI:                                                                Juan ParsleyCody Andersen is a 27 y.o. [facility] today for URI symptoms  URI   This is a new problem. The current episode started in the past 7 days (x 3 days). The problem has been unchanged. There has been no fever. Associated symptoms include congestion, headaches and a sore throat. Pertinent negatives include no coughing, nausea, swollen glands, vomiting or wheezing. He has tried decongestant for the symptoms. The treatment provided no relief.   Depression/Anxiety: he is prescribed Sertraline 100 mg and Xanax prn by his PCP. Reports he has not had his Sertraline in months due to an issue with the pharmacy. Endorses depressed mood, anhedonia, and worsening anxiety. Denies symptoms of mania/hypomania. Denies suicidal thinking. Denies auditory/visual hallucinations.    [facility] 2/9 11/28/2017 08/29/2017 05/09/2017 11/22/2016  Decreased Interest 2 1 2  0  Down, Depressed, Hopeless 3 1 1 1   PHQ - 2 Score 5 2 3 1   Altered sleeping 2 1 0 -  Tired, decreased energy 3 2 3  -  Change in appetite 0 0 0 -  Feeling bad or failure about yourself  2 0 0 -  Trouble concentrating 0 0 0 -  Moving slowly or fidgety/restless 0 0 0 -  Suicidal thoughts 0 0 0 -  PHQ-9 Score 12 5 6  -  Difficult doing work/chores Very difficult - Somewhat difficult -    GAD 7 : Generalized Anxiety Score 11/28/2017 08/29/2017  Nervous, Anxious, on Edge 3 3  Control/stop worrying 3 1  Worry too much - different things 3 1  Trouble relaxing 3 0  Restless 2 1  Easily annoyed or irritable 3 3  Afraid - awful might happen 3 0  Total GAD 7 Score 20 9  Anxiety Difficulty Very difficult -      Past Medical History:  Diagnosis Date  . Anxiety   . Hypertension   . Migraine    Past Surgical History:  Procedure Laterality Date  . MYRINGOTOMY     Social History   Tobacco Use  .  Smoking status: Never Smoker  . Smokeless tobacco: Never Used  Substance Use Topics  . Alcohol use: No   family history includes Alcohol abuse in his maternal uncle and paternal uncle; Cancer in his maternal grandfather; Depression in his maternal uncle; Diabetes in his maternal grandmother, paternal aunt, and paternal grandfather; Heart attack in his paternal grandfather; Hyperlipidemia in his father, paternal aunt, paternal grandfather, and paternal uncle; Hypertension in his father; Stroke in his maternal grandmother.    ROS: negative except as noted in the HPI  Medications: Current Outpatient Medications  Medication Sig Dispense Refill  . ALPRAZolam (XANAX) 0.5 MG tablet TAKE 1 TABLET BY MOUTH TWICE A DAY AS NEEDED FOR ANXIETY 60 tablet 5  . Icosapent Ethyl 1 g CAPS Take 2 tablets by mouth 2 (two) times daily. 360 capsule 3  . lisinopril (PRINIVIL,ZESTRIL) 20 MG tablet Take 1 tablet (20 mg total) by mouth daily. 90 tablet 1  . sertraline (ZOLOFT) 100 MG tablet Take 1 tablet (100 mg total) by mouth daily.  90 tablet 0  . trimethoprim-polymyxin b (POLYTRIM) ophthalmic solution Place 1 drop into both eyes every 4 (four) hours. For 7 days. 10 mL 0   No current facility-administered medications for this visit.    Allergies  Allergen Reactions  . Benadryl [Diphenhydramine Hcl (Sleep)]        Objective:  BP 134/85   Pulse 84   Temp 98.3 F (36.8 C) (Oral)   Wt 239 lb (108.4 kg)   BMI 33.33 kg/m  Gen:  alert, not ill-appearing, no distress, appropriate for age, obese male HEENT: head normocephalic without obvious abnormality, conjunctiva and cornea clear, nasal mucosa pink, oropharynx with erythema, no edema or exudates, neck supple, no cervical adenopathy, trachea midline Pulm: Normal work of breathing, normal phonation, clear to auscultation bilaterally, no wheezes, rales or rhonchi CV: Normal rate, regular rhythm, s1 and s2 distinct, no murmurs, clicks or rubs  Neuro: alert  and oriented x 3, no tremor MSK: extremities atraumatic, normal gait and station Skin: intact, no rashes on exposed skin, no jaundice, no cyanosis Psych: well-groomed, cooperative, good eye contact, depressed mood, affect mood-congruent, speech is articulate, and thought processes clear and goal-directed    Results for orders placed or performed in visit on 11/28/17 (from the past 72 hour(s))  POCT rapid strep A     Status: Normal   Collection Time: 11/28/17 10:15 AM  Result Value Ref Range   Rapid Strep A Screen Negative Negative   No results found.    Assessment and Plan: 27 y.o. male with   1. Acute upper respiratory infection - POCT rapid strep A negative, Centor score 1 - symptomatic management with analgesics  2. Anxiety and depression - PHQ9=12, GAD7=20, no acute safety issues - I see where prescription was sent and it looks like there was an issue with diagnosis codes. Prescription re-sent with diagnosis codes. Patient instruct to self-titrate 50 mg daily for 1 week, then 100 mg daily - sertraline (ZOLOFT) 100 MG tablet; Take 1 tablet (100 mg total) by mouth daily.  Dispense: 90 tablet; Refill: 0  3. Panic attacks - sertraline (ZOLOFT) 100 MG tablet; Take 1 tablet (100 mg total) by mouth daily.  Dispense: 90 tablet; Refill: 0   Patient education and anticipatory guidance given Patient agrees with treatment plan Follow-up with PCP in 4-6 weeks for anxiety or sooner as needed if symptoms worsen or fail to improve  Levonne Hubert PA-C

## 2018-01-09 ENCOUNTER — Encounter: Payer: Self-pay | Admitting: Physician Assistant

## 2018-01-09 ENCOUNTER — Ambulatory Visit (INDEPENDENT_AMBULATORY_CARE_PROVIDER_SITE_OTHER): Payer: BLUE CROSS/BLUE SHIELD | Admitting: Physician Assistant

## 2018-01-09 DIAGNOSIS — F329 Major depressive disorder, single episode, unspecified: Secondary | ICD-10-CM

## 2018-01-09 DIAGNOSIS — I10 Essential (primary) hypertension: Secondary | ICD-10-CM | POA: Diagnosis not present

## 2018-01-09 DIAGNOSIS — F419 Anxiety disorder, unspecified: Secondary | ICD-10-CM | POA: Diagnosis not present

## 2018-01-09 DIAGNOSIS — F410 Panic disorder [episodic paroxysmal anxiety] without agoraphobia: Secondary | ICD-10-CM

## 2018-01-09 MED ORDER — LISINOPRIL 20 MG PO TABS: 20.0000 mg | ORAL_TABLET | Freq: Every day | ORAL | 1 refills | Status: DC

## 2018-01-09 MED ORDER — ALPRAZOLAM 0.5 MG PO TABS
ORAL_TABLET | ORAL | 5 refills | Status: DC
Start: 2018-01-09 — End: 2018-03-15

## 2018-01-09 MED ORDER — SERTRALINE HCL 100 MG PO TABS: 100.0000 mg | ORAL_TABLET | Freq: Every day | ORAL | 1 refills | Status: DC

## 2018-01-09 NOTE — Progress Notes (Signed)
Subjective:    Patient ID: Juan Andersen, male    DOB: 1990/11/27, 27 y.o.   MRN: [id number]  HPI Pt is a 27 yo male who presents to the clinic for follow up.   Anxiety/Depression- doing well. Taking zoloft and xanax most days. Denies any SI/HC. His anxiety tends to be the hardest to deal with.   HTN- doing well. No CP, palpitations, headaches, or vision changes. On lisinopril but has not taken it today.   .. Active Ambulatory Problems    Diagnosis Date Noted  . Irregular heart beat 08/18/2013  . Panic attacks 08/18/2013  . Anxiety and depression 08/18/2013  . Essential hypertension, benign 08/31/2015  . Chest pain 12/09/2015  . Post concussion syndrome 01/04/2016  . Right shoulder injury 01/04/2016  . Pain of right scapula 01/04/2016  . Syncope and collapse 12/29/2016  . 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 Elevated blood lead level 05/09/2017   Resolved Ambulatory Problems    Diagnosis Date Noted  . No Resolved Ambulatory Problems   Past Medical History:  Diagnosis Date  . Anxiety   . Hypertension   . Migraine       Review of Systems  All other systems reviewed and are negative.      Objective:   Physical Exam  Constitutional: He is oriented to person, place, and time. He appears well-developed and well-nourished.  HENT:  Head: Normocephalic and atraumatic.  Cardiovascular: Normal rate and regular rhythm.  Pulmonary/Chest: Effort normal and breath sounds normal.  Neurological: He is alert and oriented to person, place, and time.  Psychiatric: He has a normal mood and affect. His behavior is normal.          Assessment & Plan:  Marland KitchenMarland KitchenKosisochukwu was seen today for anxiety, depression and hypertension.  Diagnoses and all orders for this visit:  Anxiety and depression -     sertraline (ZOLOFT) 100 MG tablet; Take 1 tablet (100 mg total) by mouth daily. -     ALPRAZolam (XANAX) 0.5 MG tablet; TAKE 1 TABLET BY MOUTH TWICE A DAY AS NEEDED FOR ANXIETY  Panic attacks -      sertraline (ZOLOFT) 100 MG tablet; Take 1 tablet (100 mg total) by mouth daily. -     ALPRAZolam (XANAX) 0.5 MG tablet; TAKE 1 TABLET BY MOUTH TWICE A DAY AS NEEDED FOR ANXIETY  Essential hypertension, benign -     lisinopril (PRINIVIL,ZESTRIL) 20 MG tablet; Take 1 tablet (20 mg total) by mouth daily.     .. Depression screen [facility] 2/9 01/09/2018 11/28/2017 08/29/2017 05/09/2017 11/22/2016  Decreased Interest 0 0  Down, Depressed, Hopeless PHQ - 2 Score Altered sleeping 0 -  Tired, decreased energy -  Change in appetite 0 0 0 0 -  Feeling bad or failure about yourself  0 2 0 0 -  Trouble concentrating 1 0 0 0 -  Moving slowly or fidgety/restless 0 0 0 0 -  Suicidal thoughts 0 0 0 0 -  PHQ-9 Score -  Difficult doing work/chores Somewhat difficult Very difficult - Somewhat difficult -  .. GAD 7 : Generalized Anxiety Score 01/09/2018 11/28/2017 08/29/2017  Nervous, Anxious, on Edge Control/stop worrying Worry too much - different things Trouble relaxing 2 3 0  Restless Easily  annoyed or irritable Afraid - awful might happen 1 3 0  Total GAD 7 Score Anxiety Difficulty Somewhat difficult Very difficult -    Refilled zoloft and xanax. Follow up in 6 months.   BP not controlled today. He did not take medication today. Discussed importance of taking medications. Follow up BP recheck in 2 weeks.

## 2018-03-15 ENCOUNTER — Other Ambulatory Visit: Payer: Self-pay | Admitting: Family Medicine

## 2018-03-15 DIAGNOSIS — F419 Anxiety disorder, unspecified: Principal | ICD-10-CM

## 2018-03-15 DIAGNOSIS — F329 Major depressive disorder, single episode, unspecified: Secondary | ICD-10-CM

## 2018-03-15 DIAGNOSIS — F410 Panic disorder [episodic paroxysmal anxiety] without agoraphobia: Secondary | ICD-10-CM

## 2018-03-15 DIAGNOSIS — F32A Depression, unspecified: Secondary | ICD-10-CM

## 2018-03-15 NOTE — Telephone Encounter (Signed)
Pt looked up in [facility] database last refill was 02/06/18 #60. Will fwd to pcp for review and signature.Heath GoldBarkley, Evanthia Maund Lynetta, CMA

## 2018-03-15 NOTE — Telephone Encounter (Signed)
Please call pharmacy. He should have 5 refills so I shouldn't need to refill them.

## 2018-03-15 NOTE — Telephone Encounter (Signed)
Was pharmacy called to look at rx. Mine has 5 refills on it?

## 2018-04-29 ENCOUNTER — Other Ambulatory Visit: Payer: Self-pay | Admitting: Physician Assistant

## 2018-04-29 DIAGNOSIS — I10 Essential (primary) hypertension: Secondary | ICD-10-CM

## 2018-06-24 ENCOUNTER — Telehealth: Payer: Self-pay | Admitting: Family Medicine

## 2018-06-24 NOTE — Telephone Encounter (Signed)
Declined. He can keep f/u appt next week with [name].

## 2018-06-24 NOTE — Telephone Encounter (Signed)
Called Pt to advise, he said he has a name/number for someone in HR regarding his complaint. I advised I would call her, but at this time there will still be no refill. What info received from HR would be routed to Provider for review. Pt states "well I'm going to need a refill, I cant last until my appointment without my anxiety meds."   Left VM for Angie [phone]), requested a callback.
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
## 2018-07-01 ENCOUNTER — Encounter: Payer: Self-pay | Admitting: Physician Assistant

## 2018-07-01 ENCOUNTER — Ambulatory Visit (INDEPENDENT_AMBULATORY_CARE_PROVIDER_SITE_OTHER): Payer: BLUE CROSS/BLUE SHIELD | Admitting: Physician Assistant

## 2018-07-01 VITALS — BP 144/82 | HR 88 | Ht 71.0 in | Wt 240.0 lb

## 2018-07-01 DIAGNOSIS — F419 Anxiety disorder, unspecified: Secondary | ICD-10-CM

## 2018-07-01 DIAGNOSIS — F410 Panic disorder [episodic paroxysmal anxiety] without agoraphobia: Secondary | ICD-10-CM | POA: Diagnosis not present

## 2018-07-01 DIAGNOSIS — F329 Major depressive disorder, single episode, unspecified: Secondary | ICD-10-CM | POA: Diagnosis not present

## 2018-07-01 DIAGNOSIS — R5383 Other fatigue: Secondary | ICD-10-CM | POA: Diagnosis not present

## 2018-07-01 MED ORDER — ALPRAZOLAM 0.5 MG PO TABS: ORAL_TABLET | ORAL | 0 refills | Status: DC

## 2018-07-01 MED ORDER — SERTRALINE HCL 100 MG PO TABS: 100.0000 mg | ORAL_TABLET | Freq: Every day | ORAL | 1 refills | Status: DC
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
## 2018-07-02 DIAGNOSIS — R5383 Other fatigue: Secondary | ICD-10-CM | POA: Insufficient documentation

## 2018-07-02 LAB — COMPREHENSIVE METABOLIC PANEL
AG Ratio: 2 (calc) (ref 1.0–2.5)
ALKALINE PHOSPHATASE (APISO): 61 U/L (ref 40–115)
ALT: 52 U/L — AB (ref 9–46)
AST: 30 U/L (ref 10–40)
Albumin: 5.1 g/dL (ref 3.6–5.1)
BUN: 13 mg/dL (ref 7–25)
CO2: 26 mmol/L (ref 20–32)
CREATININE: 1.12 mg/dL (ref 0.60–1.35)
Calcium: 10.1 mg/dL (ref 8.6–10.3)
Chloride: 101 mmol/L (ref 98–110)
GLUCOSE: 124 mg/dL — AB (ref 65–99)
Globulin: 2.5 g/dL (calc) (ref 1.9–3.7)
Potassium: 4.2 mmol/L (ref 3.5–5.3)
Sodium: 137 mmol/L (ref 135–146)
Total Bilirubin: 0.8 mg/dL (ref 0.2–1.2)
Total Protein: 7.6 g/dL (ref 6.1–8.1)

## 2018-07-02 LAB — CBC
HCT: 43.3 % (ref 38.5–50.0)
Hemoglobin: 15.4 g/dL (ref 13.2–17.1)
MCH: 29.1 pg (ref 27.0–33.0)
MCHC: 35.6 g/dL (ref 32.0–36.0)
MCV: 81.7 fL (ref 80.0–100.0)
MPV: 12.1 fL (ref 7.5–12.5)
Platelets: 252 10*3/uL (ref 140–400)
RBC: 5.3 10*6/uL (ref 4.20–5.80)
RDW: 13 % (ref 11.0–15.0)
WBC: 6.9 10*3/uL (ref 3.8–10.8)

## 2018-07-02 LAB — TSH: TSH: 1.04 mIU/L (ref 0.40–4.50)

## 2018-07-02 LAB — C-REACTIVE PROTEIN: CRP: 1.4 mg/L (ref <8.0)

## 2018-07-02 LAB — TESTOSTERONE: TESTOSTERONE: 247 ng/dL — AB (ref 250–827)

## 2018-07-02 LAB — VITAMIN D 25 HYDROXY (VIT D DEFICIENCY, FRACTURES): VIT D 25 HYDROXY: 19 ng/mL — AB (ref 30–100)

## 2018-07-02 LAB — VITAMIN B12: Vitamin B-12: 597 pg/mL (ref 200–1100)

## 2018-07-02 LAB — SEDIMENTATION RATE: Sed Rate: 6 mm/h (ref 0–15)

## 2018-07-02 LAB — FERRITIN: FERRITIN: 254 ng/mL (ref 38–380)

## 2018-07-02 NOTE — Progress Notes (Signed)
Call pt: CBC looks good.  B12 looks good.  Thyroid good.  Liver enzymes improved.  Vitamin D is low. Start 2000 units D3 OTC for 3 months and can recheck.  Testosterone is low. If you are wanting to have more children need to consider starting a testosterone booster. Starting replacement can make it harder to have children in the future.

## 2018-07-05 NOTE — Progress Notes (Signed)
Call pt: I would just get and OTC testosterone booster. If you want to do the testosterone shots need one more low testosterone level in 2 weeks. Shots however will effect fertility and we want to avoid if you want any more children.

## 2018-07-09 ENCOUNTER — Encounter: Payer: Self-pay | Admitting: Physician Assistant

## 2018-07-09 ENCOUNTER — Ambulatory Visit (INDEPENDENT_AMBULATORY_CARE_PROVIDER_SITE_OTHER): Payer: BLUE CROSS/BLUE SHIELD | Admitting: Physician Assistant

## 2018-07-09 VITALS — BP 131/72 | HR 88 | Temp 97.9°F | Wt 242.5 lb

## 2018-07-09 DIAGNOSIS — E291 Testicular hypofunction: Secondary | ICD-10-CM

## 2018-07-09 NOTE — Progress Notes (Signed)
   Subjective:    Patient ID: Juan Andersen, male    DOB: 08-Sep-1991, 27 y.o.   MRN: [id number]  HPI  Pt is a 27 year old male who presents to the clinic to discuss low testosterone levels.  He presents to the clinic with his wife.  His wife is concerned because they have been trying to have a second child for over 16 months and are not able to get pregnant.  She has appointment with a fertility specialist in the next few weeks.  His testosterone was checked because he had been very fatigued for the last 8 to 9 months.  They do wish to have more children and wonders what he can do to help his testosterone.  .. Active Ambulatory Problems    Diagnosis Date Noted  . Irregular heart beat 08/18/2013  . Panic attacks 08/18/2013  . Anxiety and depression 08/18/2013  . Essential hypertension, benign 08/31/2015  . Chest pain 12/09/2015  . Post concussion syndrome 01/04/2016  . Right shoulder injury 01/04/2016  . Pain of right scapula 01/04/2016  . Syncope and collapse 12/29/2016  . 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 Elevated blood lead level 05/09/2017  . No energy 07/02/2018  . Male hypogonadism 07/10/2018   Resolved Ambulatory Problems    Diagnosis Date Noted  . No Resolved Ambulatory Problems   Past Medical History:  Diagnosis Date  . Anxiety   . Hypertension   . Migraine      Review of Systems See HPI    Objective:   Physical Exam  Constitutional: He appears well-developed and well-nourished.  HENT:  Head: Normocephalic and atraumatic.  Cardiovascular: Normal rate.  Pulmonary/Chest: Effort normal.  Neurological: He is alert.  Psychiatric: He has a normal mood and affect. His behavior is normal.          Assessment & Plan:  Marland KitchenMarland KitchenMikhael was seen today for follow-up.  Diagnoses and all orders for this visit:  Male hypogonadism -     Ambulatory referral to Endocrinology   Testosterone level was 247.  Discussed testosterone is not always linked to decrease sperm production or  infertility.  I would suggest this being checked.  I discussed that if he wishes to have more children he does not need to engage in testosterone gels and or shots to replace testosterone.  I will refer to an endocrinologist to consider the gonadotropin treatment for male hypogonadism.  Also encouraged him to make some lifestyle adjustments to help better produce testosterone.  I gave him a large handout to read about diet, exercise, supplements, weight loss.  I encouraged him to actively pursue increasing his testosterone in these ways first.  He does wish for the referral.

## 2018-07-10 DIAGNOSIS — E291 Testicular hypofunction: Secondary | ICD-10-CM | POA: Insufficient documentation

## 2018-08-26 ENCOUNTER — Other Ambulatory Visit: Payer: Self-pay

## 2018-08-26 DIAGNOSIS — F410 Panic disorder [episodic paroxysmal anxiety] without agoraphobia: Secondary | ICD-10-CM

## 2018-08-26 DIAGNOSIS — F329 Major depressive disorder, single episode, unspecified: Secondary | ICD-10-CM

## 2018-08-26 DIAGNOSIS — F419 Anxiety disorder, unspecified: Principal | ICD-10-CM

## 2018-08-26 NOTE — Telephone Encounter (Signed)
Selena BattenCody called today in need of a refill on his xanax. The last refill was 07/01/2018. Just wanted to let you know. Thanks!

## 2018-08-27 MED ORDER — ALPRAZOLAM 0.5 MG PO TABS: ORAL_TABLET | ORAL | 0 refills | Status: DC

## 2018-09-10 ENCOUNTER — Other Ambulatory Visit: Payer: Self-pay | Admitting: Physician Assistant

## 2018-09-10 DIAGNOSIS — F329 Major depressive disorder, single episode, unspecified: Secondary | ICD-10-CM

## 2018-09-10 DIAGNOSIS — F419 Anxiety disorder, unspecified: Principal | ICD-10-CM

## 2018-09-10 DIAGNOSIS — F410 Panic disorder [episodic paroxysmal anxiety] without agoraphobia: Secondary | ICD-10-CM

## 2018-09-10 NOTE — Telephone Encounter (Signed)
Please call patient I will refill but 10 is supposed to last for the month not 2 weeks. Only use for panic attacks.

## 2018-09-12 NOTE — Telephone Encounter (Signed)
Patient advised.

## 2018-09-20 ENCOUNTER — Ambulatory Visit (INDEPENDENT_AMBULATORY_CARE_PROVIDER_SITE_OTHER): Payer: BLUE CROSS/BLUE SHIELD | Admitting: Physician Assistant

## 2018-09-20 ENCOUNTER — Encounter: Payer: Self-pay | Admitting: Physician Assistant

## 2018-09-20 VITALS — BP 154/84 | HR 85 | Ht 71.0 in | Wt 244.0 lb

## 2018-09-20 DIAGNOSIS — F329 Major depressive disorder, single episode, unspecified: Secondary | ICD-10-CM | POA: Diagnosis not present

## 2018-09-20 DIAGNOSIS — F410 Panic disorder [episodic paroxysmal anxiety] without agoraphobia: Secondary | ICD-10-CM | POA: Diagnosis not present

## 2018-09-20 DIAGNOSIS — R030 Elevated blood-pressure reading, without diagnosis of hypertension: Secondary | ICD-10-CM | POA: Diagnosis not present

## 2018-09-20 DIAGNOSIS — F419 Anxiety disorder, unspecified: Secondary | ICD-10-CM | POA: Diagnosis not present

## 2018-09-20 MED ORDER — HYDROXYZINE HCL 25 MG PO TABS: 25.0000 mg | ORAL_TABLET | Freq: Three times a day (TID) | ORAL | 2 refills | Status: DC | PRN

## 2018-09-20 MED ORDER — ALPRAZOLAM 0.5 MG PO TABS: ORAL_TABLET | ORAL | 2 refills | Status: DC

## 2018-09-20 MED ORDER — SERTRALINE HCL 100 MG PO TABS: 200.0000 mg | ORAL_TABLET | Freq: Every day | ORAL | 2 refills | Status: DC

## 2018-09-20 MED ORDER — BUSPIRONE HCL 7.5 MG PO TABS: 7.5000 mg | ORAL_TABLET | Freq: Three times a day (TID) | ORAL | 2 refills | Status: DC
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
## 2018-09-20 NOTE — Progress Notes (Deleted)
   Subjective:    Patient ID: Juan Andersen, male    DOB: 1991/08/20, 28 y.o.   MRN: [id number]  HPI  Not back to work labral tear left cannot   International aid/development worker -  Review of Systems     Objective:   Physical Exam        Assessment & Plan:

## 2018-09-23 ENCOUNTER — Encounter: Payer: Self-pay | Admitting: Physician Assistant

## 2018-10-12 ENCOUNTER — Other Ambulatory Visit: Payer: Self-pay | Admitting: Physician Assistant

## 2018-10-12 DIAGNOSIS — F419 Anxiety disorder, unspecified: Principal | ICD-10-CM

## 2018-10-12 DIAGNOSIS — F329 Major depressive disorder, single episode, unspecified: Secondary | ICD-10-CM

## 2018-10-12 DIAGNOSIS — F410 Panic disorder [episodic paroxysmal anxiety] without agoraphobia: Secondary | ICD-10-CM

## 2018-12-18 ENCOUNTER — Ambulatory Visit (INDEPENDENT_AMBULATORY_CARE_PROVIDER_SITE_OTHER): Payer: Self-pay | Admitting: Physician Assistant

## 2018-12-18 ENCOUNTER — Other Ambulatory Visit: Payer: Self-pay

## 2018-12-18 ENCOUNTER — Encounter: Payer: Self-pay | Admitting: Physician Assistant

## 2018-12-18 DIAGNOSIS — F410 Panic disorder [episodic paroxysmal anxiety] without agoraphobia: Secondary | ICD-10-CM

## 2018-12-18 DIAGNOSIS — F329 Major depressive disorder, single episode, unspecified: Secondary | ICD-10-CM

## 2018-12-18 DIAGNOSIS — F419 Anxiety disorder, unspecified: Secondary | ICD-10-CM

## 2018-12-18 DIAGNOSIS — F32A Depression, unspecified: Secondary | ICD-10-CM

## 2018-12-18 MED ORDER — ALPRAZOLAM 0.5 MG PO TABS: ORAL_TABLET | ORAL | 1 refills | Status: DC

## 2018-12-18 NOTE — Progress Notes (Deleted)
-  Xanax taking 1-2 QD PRN, increased anx with COVID, been taking 2 more often

## 2018-12-18 NOTE — Progress Notes (Signed)
Patient ID: Juan Andersen, male   DOB: 05-27-1991, 28 y.o.   MRN: [id number] .Marland KitchenVirtual Visit via Telephone Note  I connected with Juan Andersen on 12/18/18 at  8:30 AM EDT by telephone and verified that I am speaking with the correct person using two identifiers.   I discussed the limitations, risks, security and privacy concerns of performing an evaluation and management service by telephone and the availability of in person appointments. I also discussed with the patient that there may be a patient responsible charge related to this service. The patient expressed understanding and agreed to proceed.   History of Present Illness: Pt is a 28 year old male with a history of anxiety and depression with panic attacks who calls in to discuss some worsening of anxiety.  He is taking Zoloft 200 mg daily and BuSpar 7.5 mg 3 times a day.  He does have the Vistaril but does not feel like he is using that as often.  He does find the need to use Xanax once or twice a day.  The COVID pandemic has him a lot more anxious.  He is really trying to avoid going to the emergency room for panic attack.  He is currently not really working due to the pandemic.  He should have insurance shortly.  When he has panic attack he gets chest tightness, numbness and tingling all over his body, dizziness.  .. Active Ambulatory Problems    Diagnosis Date Noted  . Irregular heart beat 08/18/2013  . Panic attacks 08/18/2013  . Anxiety and depression 08/18/2013  . Essential hypertension, benign 08/31/2015  . Chest pain 12/09/2015  . Post concussion syndrome 01/04/2016  . Right shoulder injury 01/04/2016  . Pain of right scapula 01/04/2016  . Syncope and collapse 12/29/2016  . 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 Elevated blood lead level 05/09/2017  . No energy 07/02/2018  . Male hypogonadism 07/10/2018   Resolved Ambulatory Problems    Diagnosis Date Noted  . No Resolved Ambulatory Problems   Past Medical History:  Diagnosis Date  .  Anxiety   . Hypertension   . Migraine    Reviewed med, allergy and problem list.      Observations/Objective: No acute distress.   .. Today's Vitals   12/18/18 0810  Weight: 220 lb (99.8 kg)   Body mass index is 30.68 kg/m.   .. Depression screen [facility] 2/9 12/18/2018 09/20/2018 07/09/2018 07/01/2018 01/09/2018  Decreased Interest 0 3 3 3  0  Down, Depressed, Hopeless 1 3 2 2 2   PHQ - 2 Score 1 6 5 5 2   Altered sleeping 0 3 1 0 2  Tired, decreased energy 3 3 3 3 2   Change in appetite 0 3 2 1  0  Feeling bad or failure about yourself  0 3 0 0 0  Trouble concentrating 0 2 0 0 1  Moving slowly or fidgety/restless 0 0 0 0 0  Suicidal thoughts 0 0 - 0 0  PHQ-9 Score 4 20 11 9 7   Difficult doing work/chores Somewhat difficult Not difficult at all Very difficult Very difficult Somewhat difficult   .Marland Kitchen GAD 7 : Generalized Anxiety Score 12/18/2018 09/20/2018 07/09/2018 07/01/2018  Nervous, Anxious, on Edge 3 3 3 3   Control/stop worrying 3 3 3 3   Worry too much - different things 3 3 2 2   Trouble relaxing 1 3 2 1   Restless 0 2 2 1   Easily annoyed or irritable 3 3 3 3   Afraid - awful might happen 1 1  0 2  Total GAD 7 Score 14 18 15 15   Anxiety Difficulty Extremely difficult Not difficult at all Very difficult Very difficult     Assessment and Plan: Marland Kitchen.Marland Kitchen.Diagnoses and all orders for this visit:  Anxiety and depression -     ALPRAZolam (XANAX) 0.5 MG tablet; TAKE ONE TABLET BY MOUTH DAILY AS NEEDED FOR ANXIETY -     Ambulatory referral to Psychology  Panic attacks -     ALPRAZolam (XANAX) 0.5 MG tablet; TAKE ONE TABLET BY MOUTH DAILY AS NEEDED FOR ANXIETY -     Ambulatory referral to Psychology   Last xanax refill was 3/14 for 30 .Marland Kitchen.PDMP reviewed during this encounter.  I did temporarily increase his Xanax to 45 tablets a month with 1 refill.  I discussed him using his Vistaril more often up to 3 times a day and using Xanax as supplement.  I strongly encouraged physical exercise and  cognitive behavioral techniques to breathe and talk his way out of panic attacks.  I do think counseling would help him tremendously.  I went ahead and made referral for Shanda BumpsJessica in our office to do some teletherapy with patient.  Encourage patient to continue Zoloft and BuSpar daily.  I also discussed Lovella which is lavender that has been [place] to decrease anxiety.  I think patient should consider giving this a try.  Follow-up in 2 months.  Follow Up Instructions:    I discussed the assessment and treatment plan with the patient. The patient was provided an opportunity to ask questions and all were answered. The patient agreed with the plan and demonstrated an understanding of the instructions.   The patient was advised to call back or seek an in-person evaluation if the symptoms worsen or if the condition fails to improve as anticipated.  I provided 15 minutes of non-face-to-face time during this encounter.   Tandy GawJade , PA-C

## 2019-01-15 ENCOUNTER — Emergency Department (INDEPENDENT_AMBULATORY_CARE_PROVIDER_SITE_OTHER): Payer: Self-pay

## 2019-01-15 ENCOUNTER — Ambulatory Visit (INDEPENDENT_AMBULATORY_CARE_PROVIDER_SITE_OTHER): Payer: Self-pay | Admitting: Sports Medicine

## 2019-01-15 ENCOUNTER — Emergency Department (INDEPENDENT_AMBULATORY_CARE_PROVIDER_SITE_OTHER)
Admission: EM | Admit: 2019-01-15 | Discharge: 2019-01-15 | Disposition: A | Discharge status: Home / Self Care | Payer: Self-pay | Source: Home / Self Care

## 2019-01-15 ENCOUNTER — Other Ambulatory Visit: Payer: Self-pay

## 2019-01-15 ENCOUNTER — Encounter: Payer: Self-pay | Admitting: Emergency Medicine

## 2019-01-15 DIAGNOSIS — M25532 Pain in left wrist: Secondary | ICD-10-CM

## 2019-01-15 DIAGNOSIS — S63502A Unspecified sprain of left wrist, initial encounter: Secondary | ICD-10-CM

## 2019-01-15 DIAGNOSIS — M778 Other enthesopathies, not elsewhere classified: Secondary | ICD-10-CM | POA: Insufficient documentation

## 2019-01-15 MED ORDER — PREDNISONE 50 MG PO TABS: ORAL_TABLET | ORAL | 0 refills | Status: DC

## 2019-01-15 NOTE — ED Triage Notes (Signed)
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 He has not travelled out of local area past month.

## 2019-01-15 NOTE — Discharge Instructions (Signed)
°  Please take the medication as prescribed by Dr. Benjamin Stain (Dr. Karie Schwalbe) and wear the wrist splint for comfort until you follow up next week with Sports Medicine. Please call or use your MyChart account to schedule an appointment with Dr. Denyse Amass or Dr. Benjamin Stain later next week.

## 2019-01-15 NOTE — ED Provider Notes (Signed)
[facility]    CSN: [id number] Arrival date & time: 01/15/19  1616     History   Chief Complaint Chief Complaint  Patient presents with  . Wrist Pain    HPI Juan Andersen is a 28 y.o. male.   HPI  Juan Andersen is a 28 y.o. male presenting to UC with c/o Left wrist pain and swelling with decreased ROM due to the pain. Pain is 10/10. 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  Pt has since been taking flexeril, toradol, and ibuprofen w/o relief. Last dose of flexeril and toradol (from prior hip injury) was about 2 hours PTA.  He bought an OTC wrist brace yesterday but he does not feel like it has helped. He has had mild carpal tunnel in the same wrist before but never had this severe of pain.  He is Right hand dominant.    Past Medical History:  Diagnosis Date  . Anxiety   . Hypertension   . Migraine     Patient Active Problem List   Diagnosis Date Noted  . Tendinitis of left wrist 01/15/2019  . 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 No energy 07/02/2018  . 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 Elevated blood lead level 05/09/2017  . Syncope and collapse 12/29/2016  . Post concussion syndrome 01/04/2016  . Right shoulder injury 01/04/2016  . Pain of right scapula 01/04/2016  . Chest pain 12/09/2015  . Essential hypertension, benign 08/31/2015  . Irregular heart beat 08/18/2013  . Panic attacks 08/18/2013  . Anxiety and depression 08/18/2013    Past Surgical History:  Procedure Laterality Date  . MYRINGOTOMY         Home Medications    Prior to Admission medications   Medication Sig Start Date End Date Taking? Authorizing Provider  cyclobenzaprine (FLEXERIL) 10 MG tablet Take 10 mg by mouth 3 (three) times daily as needed for muscle spasms.   Yes [provider]  ketorolac (TORADOL) 10 MG tablet Take 10 mg by mouth every 6 (six) hours as needed.   Yes [provider]  ALPRAZolam Prudy Feeler(XANAX) 0.5 MG  tablet TAKE ONE TABLET BY MOUTH DAILY AS NEEDED FOR ANXIETY 12/18/18   Breeback, [name] L, PA-C  busPIRone (BUSPAR) 7.5 MG tablet Take 1 tablet (7.5 mg total) by mouth 3 (three) times daily. 09/20/18   Breeback, [name] L, PA-C  hydrOXYzine (ATARAX/VISTARIL) 25 MG tablet Take 1 tablet (25 mg total) by mouth 3 (three) times daily as needed. 09/20/18   Breeback, [name] L, PA-C  Icosapent Ethyl 1 g CAPS Take 2 tablets by mouth 2 (two) times daily. 10/18/17   Breeback, [name] L, PA-C  lisinopril (PRINIVIL,ZESTRIL) 20 MG tablet TAKE 1 TABLET BY MOUTH EVERY DAY 04/29/18   Breeback, [name] L, PA-C  predniSONE (DELTASONE) 50 MG tablet One tab PO daily for 5 days. 01/15/19   Monica Bectonhekkekandam, Thomas J, MD  sertraline (ZOLOFT) 100 MG tablet Take 2 tablets (200 mg total) by mouth daily. 09/20/18   Jomarie LongsBreeback, [name] L, PA-C    Family History Family History  Problem Relation Age of Onset  . Hyperlipidemia Father   . Hypertension Father   . Alcohol abuse Maternal Uncle   . Depression Maternal Uncle   . Diabetes Paternal Aunt   . Hyperlipidemia Paternal Aunt   . Alcohol abuse Paternal Uncle   . Hyperlipidemia Paternal Uncle   . Diabetes Maternal Grandmother   . Stroke Maternal  Grandmother   . Cancer Maternal Grandfather   . Heart attack Paternal Grandfather   . Diabetes Paternal Grandfather   . Hyperlipidemia Paternal Grandfather   . Seizures Neg Hx     Social History Social History   Tobacco Use  . Smoking status: Never Smoker  . Smokeless tobacco: Never Used  Substance Use Topics  . Alcohol use: No  . Drug use: No     Allergies   Benadryl [diphenhydramine hcl (sleep)] and Tramadol   Review of Systems Review of Systems  Musculoskeletal: Positive for arthralgias, joint swelling and myalgias.  Skin: Negative for color change, rash and wound.  Neurological: Positive for weakness. Negative for numbness.     Physical Exam Triage Vital Signs ED Triage Vitals  Enc Vitals Group     BP 01/15/19 1633 (!) 157/93      Pulse Rate 01/15/19 1633 92     Resp 01/15/19 1633 18     Temp 01/15/19 1633 98.3 F (36.8 C)     Temp Source 01/15/19 1633 Oral     SpO2 01/15/19 1633 98 %     Weight 01/15/19 1634 225 lb (102.1 kg)     Height 01/15/19 1634  (1.803 m)     Head Circumference --      Peak Flow --      Pain Score 01/15/19 1634 10     Pain Loc --      Pain Edu? --      Excl. in GC? --    No data found.  Updated Vital Signs BP (!) 157/93 (BP Location: Right Arm)   Pulse 92   Temp 98.3 F (36.8 C) (Oral)   Resp 18   Ht  (1.803 m)   Wt 225 lb (102.1 kg)   SpO2 98%   BMI 31.38 kg/m   Visual Acuity Right Eye Distance:   Left Eye Distance:   Bilateral Distance:    Right Eye Near:   Left Eye Near:    Bilateral Near:     Physical Exam Vitals signs and nursing note reviewed.  Constitutional:      Appearance: He is well-developed.  HENT:     Head: Normocephalic and atraumatic.  Neck:     Musculoskeletal: Normal range of motion.  Cardiovascular:     Rate and Rhythm: Normal rate.  Pulmonary:     Effort: Pulmonary effort is normal.  Musculoskeletal:        General: Swelling and tenderness present.     Comments: Left wrist: mild edema. Tenderness along volar radial aspect. Limited ROM due to pain.  Unable to make a full wrist Left elbow: non-tender, full ROM  Skin:    General: Skin is warm and dry.     Capillary Refill: Capillary refill takes less than 2 seconds.     Findings: No bruising, erythema or rash.  Neurological:     Mental Status: He is alert and oriented to person, place, and time.  Psychiatric:        Behavior: Behavior normal.      UC Treatments / Results  Labs (all labs ordered are listed, but only abnormal results are displayed) Labs Reviewed - No data to display  EKG None  Radiology Dg Wrist Complete Left  Result Date: 01/15/2019 CLINICAL DATA:  Left wrist pain at the base of his thumb for the past 6 days. No known injury. EXAM: LEFT WRIST -  COMPLETE 3+ VIEW COMPARISON:  None. FINDINGS: There is no  evidence of fracture or dislocation. There is no evidence of arthropathy or other focal bone abnormality. Soft tissues are unremarkable. IMPRESSION: Negative. Electronically Signed   By: Obie Dredge M.D.   On: 01/15/2019 16:51    Procedures Procedures (including critical care time)  Medications Ordered in UC Medications - No data to display  Initial Impression / Assessment and Plan / UC Course  I have reviewed the triage vital signs and the nursing notes.  Pertinent labs & imaging results that were available during my care of the patient were reviewed by me and considered in my medical decision making (see chart for details).     Plain films: Normal Consulted with Dr. Benjamin Stain, Sports Medicine, who also examined pt. Will tx for tendonitis of Left wrist Pt placed in thumb spica splint and prednisone prescribed. Home care info provided.  Pt to f/u with Sports Medicine in 1 week.  Final Clinical Impressions(s) / UC Diagnoses   Final diagnoses:  Left wrist sprain, initial encounter     Discharge Instructions      Please take the medication as prescribed by Dr. Benjamin Stain (Dr. Karie Schwalbe) and wear the wrist splint for comfort until you follow up next week with Sports Medicine. Please call or use your MyChart account to schedule an appointment with Dr. Denyse Amass or Dr. Benjamin Stain later next week.    ED Prescriptions     Prednisone   50mg  Once daily for 5 days     Controlled Substance Prescriptions [place] Controlled Substance Registry consulted? Not Applicable   Rolla Plate 01/15/19 1723

## 2019-01-15 NOTE — Assessment & Plan Note (Addendum)
Flexor pollicis longus, flexor digitorum profundus and superficialis tendinitis. 5 days of prednisone, thumb spica brace. Return to see either myself or Dr. Denyse Amass in 2 weeks. Right-handed duty at work for the next 2 weeks.

## 2019-01-15 NOTE — Progress Notes (Signed)
Subjective:    CC: Left wrist pain  HPI: This is a pleasant 28 year old male, he works in [employer].  He has had a pretty busy past week, and has developed some pain in his left wrist, volar aspect with radiation from the volar forearm into the thumb, index, middle fingers.  Severe, persistent, localized without radiation proximal to the elbow.  No trauma, no numbness or tingling.  No swelling.  I reviewed the past medical history, family history, social history, surgical history, and allergies today and no changes were needed.  Please see the problem list section below in epic for further details.  Past Medical History: Past Medical History:  Diagnosis Date  . Anxiety   . Hypertension   . Migraine    Past Surgical History: Past Surgical History:  Procedure Laterality Date  . MYRINGOTOMY     Social History: Social History   Socioeconomic History  . Marital status: Married    Spouse name: Not on file  . Number of children: 1  . Years of education: 66  . Highest education level: Not on file  Occupational History  . Occupation: [employer]  . Financial resource strain: Not on file  . Food insecurity:    Worry: Not on file    Inability: Not on file  . Transportation needs:    Medical: Not on file    Non-medical: Not on file  Tobacco Use  . Smoking status: Never Smoker  . Smokeless tobacco: Never Used  Substance and Sexual Activity  . Alcohol use: No  . Drug use: No  . Sexual activity: Never  Lifestyle  . Physical activity:    Days per week: Not on file    Minutes per session: Not on file  . Stress: Not on file  Relationships  . Social connections:    Talks on phone: Not on file    Gets together: Not on file    Attends religious service: Not on file    Active member of club or organization: Not on file    Attends meetings of clubs or organizations: Not on file    Relationship status: Not on file  Other Topics Concern  . Not on file   Social History Narrative   Lives at home w/ his wife and son   Caffeine: tea daily   Family History: Family History  Problem Relation Age of Onset  . Hyperlipidemia Father   . Hypertension Father   . Alcohol abuse Maternal Uncle   . Depression Maternal Uncle   . Diabetes Paternal Aunt   . Hyperlipidemia Paternal Aunt   . Alcohol abuse Paternal Uncle   . Hyperlipidemia Paternal Uncle   . Diabetes Maternal Grandmother   . Stroke Maternal Grandmother   . Cancer Maternal Grandfather   . Heart attack Paternal Grandfather   . Diabetes Paternal Grandfather   . Hyperlipidemia Paternal Grandfather   . Seizures Neg Hx    Allergies: Allergies  Allergen Reactions  . Benadryl [Diphenhydramine Hcl (Sleep)]   . Tramadol     Sleepy.    Medications: See med rec.  Review of Systems: No fevers, chills, night sweats, weight loss, chest pain, or shortness of breath.   Objective:    General: Well Developed, well nourished, and in no acute distress.  Neuro: Alert and oriented x3, extra-ocular muscles intact, sensation grossly intact.  HEENT: Normocephalic, atraumatic, pupils equal round reactive to light, neck supple, no masses, no lymphadenopathy, thyroid nonpalpable.  Skin: Warm and  dry, no rashes. Cardiac: Regular rate and rhythm, no murmurs rubs or gallops, no lower extremity edema.  Respiratory: Clear to auscultation bilaterally. Not using accessory muscles, speaking in full sentences. Left wrist: Inspection normal with no visible erythema or swelling. ROM smooth and normal with good flexion and extension and ulnar/radial deviation that is symmetrical with opposite wrist. Tender to palpation over the volar carpal tunnel, reproduction of pain with flexion of the thumb interphalangeal joint as well as the second and third fingers.  This is consistent with flexor pollicis longus and flexor digitorum tenosynovitis. No snuffbox tenderness. No tenderness over Canal of Guyon. Strength 5/5 in  all directions without pain. Negative tinel's and phalens signs. Negative Finkelstein sign. Negative Watson's test.  X-rays negative for fracture.  Impression and Recommendations:    Tendinitis of left wrist Flexor pollicis longus, flexor digitorum profundus and superficialis tendinitis. 5 days of prednisone, thumb spica brace. Return to see either myself or Dr. Denyse Amassorey in 2 weeks. Right-handed duty at work for the next 2 weeks.   ___________________________________________ Ihor Austinhomas J. Benjamin Stainhekkekandam, M.D., ABFM., CAQSM. Primary Care and Sports Medicine [facility]  Adjunct Professor of Family Medicine  [employer]

## 2019-01-16 ENCOUNTER — Ambulatory Visit: Payer: Self-pay | Admitting: Family Medicine

## 2019-04-09 ENCOUNTER — Encounter: Payer: Self-pay | Admitting: Osteopathic Medicine

## 2019-04-09 ENCOUNTER — Ambulatory Visit (INDEPENDENT_AMBULATORY_CARE_PROVIDER_SITE_OTHER): Payer: BLUE CROSS/BLUE SHIELD | Admitting: Osteopathic Medicine

## 2019-04-09 VITALS — BP 92/54 | HR 99

## 2019-04-09 DIAGNOSIS — E860 Dehydration: Secondary | ICD-10-CM | POA: Diagnosis not present

## 2019-04-09 DIAGNOSIS — R34 Anuria and oliguria: Secondary | ICD-10-CM

## 2019-04-09 DIAGNOSIS — Z789 Other specified health status: Secondary | ICD-10-CM

## 2019-04-09 DIAGNOSIS — R252 Cramp and spasm: Secondary | ICD-10-CM

## 2019-04-09 NOTE — Progress Notes (Signed)
Virtual Visit via Phone  I connected with      Juan Andersen on 04/09/19 at 4:20 by a telemedicine application and verified that I am speaking with the correct person using two identifiers.  Patient is at home I am in office   I discussed the limitations of evaluation and management by telemedicine and the availability of in person appointments. The patient expressed understanding and agreed to proceed.  History of Present Illness: Juan ParsleyCody Kiper is a 28 y.o. male who would like to discuss dehydration   Patient reports that over the past several days, has been a bit concerned about dehydration.  Has been working outside in the heat, was having some significant muscle cramping, not noticing any salt stains on his sweat on his shirt, so he took a bunch of Pedialyte, magnesium and potassium supplements.  He has not urinated in the past 2 days.  Reports some persistent muscle cramping, though better than it was.  Reports at one point his vision blacked out but that seems to have resolved.  Reports dizziness.  No chest pain, no shortness of breath.        Observations/Objective: BP (!) 92/54   Pulse 99  BP Readings from Last 3 Encounters:  04/09/19 (!) 92/54  01/15/19 (!) 157/93  09/20/18 (!) 154/84   Exam: Normal Speech.    Lab and Radiology Results No results found for this or any previous visit (from the past 72 hour(s)). No results found.     Assessment and Plan: 28 y.o. male with The primary encounter diagnosis was Dehydration. Diagnoses of Muscle cramp, Oliguria and anuria, and Takes dietary supplements were also pertinent to this visit.  Patient was advised to immediately go to urgent care or emergency room for urgent lab evaluation and likely he needs IV fluid resuscitation given concerning symptoms of oliguria/anuria, muscle cramping, various over-the-counter electrolyte supplementations having been taken.  I definitely have concerns for possible AKI/ARF, rhabdomyolysis.  The  basics of these conditions were explained to the patient, as well as the importance of treating these urgently.  Patient declines due to financial reasons.  I stated that at the very least, we should get blood work done ASAP, he would rather come to the office tomorrow instead of urgent care or ER, this is mostly due to financial issues.  I told patient that as long as no significant concerns on labs, I would be okay to administer IV fluids in the office but we put him at significant risk by delaying treatment.  Patient accepts risk of complications including renal failure, death.  I have signed out this information to the on-call provider for the practice, advised them that if he has any significant laboratory derangements he should proceed immediately to the emergency room as he may require admission for long-term fluid resuscitation and monitoring of kidney function.  Hopefully, everything is looking okay and we can handle this as an outpatient, but my hopes are not high for this. Pt was advised hold ALL medications - med list includes NSAIDS and ACEI.        Follow Up Instructions: Return for Patient was double booked for tomorrow to come in for IV fluids, assuming labs okay.    I discussed the assessment and treatment plan with the patient. The patient was provided an opportunity to ask questions and all were answered. The patient agreed with the plan and demonstrated an understanding of the instructions.   The patient was advised to call back or seek  an in-person evaluation if any new concerns, if symptoms worsen or if the condition fails to improve as anticipated.  25 minutes of non-face-to-face time was provided during this encounter.                      Historical information moved to improve visibility of documentation.  Past Medical History:  Diagnosis Date  . Anxiety   . Hypertension   . Migraine    Past Surgical History:  Procedure Laterality Date  .  MYRINGOTOMY     Social History   Tobacco Use  . Smoking status: Never Smoker  . Smokeless tobacco: Never Used  Substance Use Topics  . Alcohol use: No   family history includes Alcohol abuse in his maternal uncle and paternal uncle; Cancer in his maternal grandfather; Depression in his maternal uncle; Diabetes in his maternal grandmother, paternal aunt, and paternal grandfather; Heart attack in his paternal grandfather; Hyperlipidemia in his father, paternal aunt, paternal grandfather, and paternal uncle; Hypertension in his father; Stroke in his maternal grandmother.  Medications: Current Outpatient Medications  Medication Sig Dispense Refill  . ALPRAZolam (XANAX) 0.5 MG tablet TAKE ONE TABLET BY MOUTH DAILY AS NEEDED FOR ANXIETY 45 tablet 1  . busPIRone (BUSPAR) 7.5 MG tablet Take 1 tablet (7.5 mg total) by mouth 3 (three) times daily. 90 tablet 2  . cyclobenzaprine (FLEXERIL) 10 MG tablet Take 10 mg by mouth 3 (three) times daily as needed for muscle spasms.    . hydrOXYzine (ATARAX/VISTARIL) 25 MG tablet Take 1 tablet (25 mg total) by mouth 3 (three) times daily as needed. 90 tablet 2  . Icosapent Ethyl 1 g CAPS Take 2 tablets by mouth 2 (two) times daily. 360 capsule 3  . ketorolac (TORADOL) 10 MG tablet Take 10 mg by mouth every 6 (six) hours as needed.    Marland Kitchen lisinopril (PRINIVIL,ZESTRIL) 20 MG tablet TAKE 1 TABLET BY MOUTH EVERY DAY 90 tablet 1  . sertraline (ZOLOFT) 100 MG tablet Take 2 tablets (200 mg total) by mouth daily. 60 tablet 2   No current facility-administered medications for this visit.    Allergies  Allergen Reactions  . Benadryl [Diphenhydramine Hcl (Sleep)]   . Tramadol     Sleepy.     PDMP not reviewed this encounter. Orders Placed This Encounter  Procedures  . CBC  . COMPLETE METABOLIC PANEL WITH GFR  . Magnesium  . Phosphorus  . CK  . Urinalysis, Routine w reflex microscopic   No orders of the defined types were placed in this encounter.

## 2019-04-10 ENCOUNTER — Ambulatory Visit (INDEPENDENT_AMBULATORY_CARE_PROVIDER_SITE_OTHER): Payer: BLUE CROSS/BLUE SHIELD | Admitting: Osteopathic Medicine

## 2019-04-10 ENCOUNTER — Encounter: Payer: Self-pay | Admitting: Osteopathic Medicine

## 2019-04-10 ENCOUNTER — Other Ambulatory Visit: Payer: Self-pay

## 2019-04-10 VITALS — BP 131/76 | HR 68 | Temp 98.1°F | Ht 71.0 in | Wt 234.0 lb

## 2019-04-10 DIAGNOSIS — E860 Dehydration: Secondary | ICD-10-CM | POA: Diagnosis not present

## 2019-04-10 LAB — COMPLETE METABOLIC PANEL WITH GFR
AG Ratio: 1.8 (calc) (ref 1.0–2.5)
ALT: 43 U/L (ref 9–46)
AST: 24 U/L (ref 10–40)
Albumin: 4.8 g/dL (ref 3.6–5.1)
Alkaline phosphatase (APISO): 60 U/L (ref 36–130)
BUN/Creatinine Ratio: 27 (calc) — ABNORMAL HIGH (ref 6–22)
BUN: 40 mg/dL — ABNORMAL HIGH (ref 7–25)
CO2: 25 mmol/L (ref 20–32)
Calcium: 10.1 mg/dL (ref 8.6–10.3)
Chloride: 102 mmol/L (ref 98–110)
Creat: 1.48 mg/dL — ABNORMAL HIGH (ref 0.60–1.35)
GFR, Est African American: 74 mL/min/{1.73_m2} (ref >=60)
GFR, Est Non African American: 63 mL/min/{1.73_m2} (ref >=60)
Globulin: 2.7 g/dL (calc) (ref 1.9–3.7)
Glucose, Bld: 114 mg/dL — ABNORMAL HIGH (ref 65–99)
Potassium: 5.6 mmol/L — ABNORMAL HIGH (ref 3.5–5.3)
Sodium: 135 mmol/L (ref 135–146)
Total Bilirubin: 1.4 mg/dL — ABNORMAL HIGH (ref 0.2–1.2)
Total Protein: 7.5 g/dL (ref 6.1–8.1)

## 2019-04-10 LAB — CBC
HCT: 42.8 % (ref 38.5–50.0)
Hemoglobin: 15.2 g/dL (ref 13.2–17.1)
MCH: 30.1 pg (ref 27.0–33.0)
MCHC: 35.5 g/dL (ref 32.0–36.0)
MCV: 84.8 fL (ref 80.0–100.0)
MPV: 11.5 fL (ref 7.5–12.5)
Platelets: 236 10*3/uL (ref 140–400)
RBC: 5.05 10*6/uL (ref 4.20–5.80)
RDW: 13.7 % (ref 11.0–15.0)
WBC: 6.4 10*3/uL (ref 3.8–10.8)

## 2019-04-10 LAB — CK: Total CK: 346 U/L — ABNORMAL HIGH (ref 44–196)

## 2019-04-10 LAB — URINALYSIS, ROUTINE W REFLEX MICROSCOPIC

## 2019-04-10 LAB — MAGNESIUM: Magnesium: 2.4 mg/dL (ref 1.5–2.5)

## 2019-04-10 LAB — PHOSPHORUS: Phosphorus: 2.8 mg/dL (ref 2.5–4.5)

## 2019-04-10 MED ORDER — SODIUM CHLORIDE 0.9 % IV BOLUS
1000.0000 mL | Freq: Once | INTRAVENOUS | Status: AC
  Administered 2019-04-10: 1000 mL via INTRAVENOUS

## 2019-04-10 NOTE — Progress Notes (Signed)
Consulted by Dr. Sheppard Coil for IV access.  Inserted #22 gauge, 1-inch BD Insyte angiocatheter in the left cephalic vein on 2 attempts. Good blood return. Flushed with normal saline.   Nelson Chimes PA-C

## 2019-04-10 NOTE — Addendum Note (Signed)
Addended by: Maryla Morrow on: 04/10/2019 04:38 PM   Modules accepted: Orders

## 2019-04-10 NOTE — Progress Notes (Signed)
HPI: Juan Andersen is a 28 y.o. male who  has a past medical history of Anxiety, Hypertension, and Migraine.  he presents to [facility] today, 04/10/19,  for chief complaint of:  Dehydration   Patient was unable to get blood work done last night.  He came into the lab but forgot his insurance card in his car.  When he asked to the lab employee if he could run out and get it and come right back, they said yes but when he returned he was locked out.  A complaint will be filed with [facility].  Patient did urinate last night, doing a little bit better today, still "like crap," tired and achy.   Juan Landngela was able to draw blood no problem.    Juan Frayharley Cummings PA-C placed IV and pt was given 1L NS and felt better afterwards.       At today's visit 04/10/19 ... PMH, PSH, FH reviewed and updated as needed.  Current medication list and allergy/intolerance hx reviewed and updated as needed. (See remainder of HPI, ROS, Phys Exam below)   No results found.  Results for orders placed or performed in visit on 04/09/19 (from the past 72 hour(s))  CBC     Status: None   Collection Time: 04/09/19  4:31 PM  Result Value Ref Range   WBC 6.4 3.8 - 10.8 Thousand/uL   RBC 5.05 4.20 - 5.80 Million/uL   Hemoglobin 15.2 13.2 - 17.1 g/dL   HCT 40.942.8 81.138.5 - 91.450.0 %   MCV 84.8 80.0 - 100.0 fL   MCH 30.1 27.0 - 33.0 pg   MCHC 35.5 32.0 - 36.0 g/dL   RDW 78.213.7 95.611.0 - 21.315.0 %   Platelets 236 140 - 400 Thousand/uL   MPV 11.5 7.5 - 12.5 fL  COMPLETE METABOLIC PANEL WITH GFR     Status: Abnormal   Collection Time: 04/09/19  4:31 PM  Result Value Ref Range   Glucose, Bld 114 (H) 65 - 99 mg/dL    Comment: .            Fasting reference interval . For someone without known diabetes, a glucose value between 100 and 125 mg/dL is consistent with prediabetes and should be confirmed with a follow-up test. .    BUN 40 (H) 7 - 25 mg/dL   Creat 0.861.48 (H) 5.780.60 - 1.35 mg/dL   GFR, Est  Non African American 63 > OR = 60 mL/min/1.2973m2   GFR, Est African American 74 > OR = 60 mL/min/1.2273m2   BUN/Creatinine Ratio 27 (H) 6 - 22 (calc)   Sodium 135 135 - 146 mmol/L   Potassium 5.6 (H) 3.5 - 5.3 mmol/L   Chloride 102 98 - 110 mmol/L   CO2 25 20 - 32 mmol/L   Calcium 10.1 8.6 - 10.3 mg/dL   Total Protein 7.5 6.1 - 8.1 g/dL   Albumin 4.8 3.6 - 5.1 g/dL   Globulin 2.7 1.9 - 3.7 g/dL (calc)   AG Ratio 1.8 1.0 - 2.5 (calc)   Total Bilirubin 1.4 (H) 0.2 - 1.2 mg/dL   Alkaline phosphatase (APISO) 60 36 - 130 U/L   AST 24 10 - 40 U/L   ALT 43 9 - 46 U/L  Magnesium     Status: None   Collection Time: 04/09/19  4:31 PM  Result Value Ref Range   Magnesium 2.4 1.5 - 2.5 mg/dL  Phosphorus     Status: None   Collection Time:  04/09/19  4:31 PM  Result Value Ref Range   Phosphorus 2.8 2.5 - 4.5 mg/dL  CK     Status: Abnormal   Collection Time: 04/09/19  4:31 PM  Result Value Ref Range   Total CK 346 (H) 44 - 196 U/L  Urinalysis, Routine w reflex microscopic     Status: None   Collection Time: 04/09/19  4:31 PM  Result Value Ref Range   Color, Urine CANCELED     Comment: TEST NOT PERFORMED . No urine received.  Result canceled by the ancillary.           ASSESSMENT/PLAN: The encounter diagnosis was Dehydration.   Doing well after IV fluids   Labs not terrible! Will follow.   No orders of the defined types were placed in this encounter.    Meds ordered this encounter  Medications  . sodium chloride 0.9 % bolus 1,000 mL    Patient Instructions  Dehydration, Adult  Dehydration is a condition in which there is not enough fluid or water in the body. This happens when you lose more fluids than you take in. Important organs, such as the kidneys, brain, and heart, cannot function without a proper amount of fluids. Any loss of fluids from the body can lead to dehydration. Dehydration can range from mild to severe. This condition should be treated right away to  prevent it from becoming severe. What are the causes? This condition may be caused by:  Vomiting.  Diarrhea.  Excessive sweating, such as from heat exposure or exercise.  Not drinking enough fluid, especially: ? When ill. ? While doing activity that requires a lot of energy.  Excessive urination.  Fever.  Infection.  Certain medicines, such as medicines that cause the body to lose excess fluid (diuretics).  Inability to access safe drinking water.  Reduced physical ability to get adequate water and food. What increases the risk? This condition is more likely to develop in people:  Who have a poorly controlled long-term (chronic) illness, such as diabetes, heart disease, or kidney disease.  Who are age 28 or older.  Who are disabled.  Who live in a place with high altitude.  Who play endurance sports. What are the signs or symptoms? Symptoms of mild dehydration may include:  Thirst.  Dry lips.  Slightly dry mouth.  Dry, warm skin.  Dizziness. Symptoms of moderate dehydration may include:  Very dry mouth.  Muscle cramps.  Dark urine. Urine may be the color of tea.  Decreased urine production.  Decreased tear production.  Heartbeat that is irregular or faster than normal (palpitations).  Headache.  Light-headedness, especially when you stand up from a sitting position.  Fainting (syncope). Symptoms of severe dehydration may include:  Changes in skin, such as: ? Cold and clammy skin. ? Blotchy (mottled) or pale skin. ? Skin that does not quickly return to normal after being lightly pinched and released (poor skin turgor).  Changes in body fluids, such as: ? Extreme thirst. ? No tear production. ? Inability to sweat when body temperature is high, such as in hot weather. ? Very little urine production.  Changes in vital signs, such as: ? Weak pulse. ? Pulse that is more than 100 beats a minute when sitting still. ? Rapid breathing. ? Low  blood pressure.  Other changes, such as: ? Sunken eyes. ? Cold hands and feet. ? Confusion. ? Lack of energy (lethargy). ? Difficulty waking up from sleep. ? Short-term weight loss. ? Unconsciousness.  How is this diagnosed? This condition is diagnosed based on your symptoms and a physical exam. Blood and urine tests may be done to help confirm the diagnosis. How is this treated? Treatment for this condition depends on the severity. Mild or moderate dehydration can often be treated at home. Treatment should be started right away. Do not wait until dehydration becomes severe. Severe dehydration is an emergency and it needs to be treated in a hospital. Treatment for mild dehydration may include:  Drinking more fluids.  Replacing salts and minerals in your blood (electrolytes) that you may have lost. Treatment for moderate dehydration may include:  Drinking an oral rehydration solution (ORS). This is a drink that helps you replace fluids and electrolytes (rehydrate). It can be found at pharmacies and retail stores. Treatment for severe dehydration may include:  Receiving fluids through an IV tube.  Receiving an electrolyte solution through a feeding tube that is passed through your nose and into your stomach (nasogastric tube, or NG tube).  Correcting any abnormalities in electrolytes.  Treating the underlying cause of dehydration. Follow these instructions at home:  If directed by your health care provider, drink an ORS: ? Make an ORS by following instructions on the package. ? Start by drinking small amounts, about  cup (120 mL) every 5-10 minutes. ? Slowly increase how much you drink until you have taken the amount recommended by your health care provider.  Drink enough clear fluid to keep your urine clear or pale yellow. If you were told to drink an ORS, finish the ORS first, then start slowly drinking other clear fluids. Drink fluids such as: ? Water. Do not drink only water.  Doing that can lead to having too little salt (sodium) in the body (hyponatremia). ? Ice chips. ? Fruit juice that you have added water to (diluted fruit juice). ? Low-calorie sports drinks.  Avoid: ? Alcohol. ? Drinks that contain a lot of sugar. These include high-calorie sports drinks, fruit juice that is not diluted, and soda. ? Caffeine. ? Foods that are greasy or contain a lot of fat or sugar.  Take over-the-counter and prescription medicines only as told by your health care provider.  Do not take sodium tablets. This can lead to having too much sodium in the body (hypernatremia).  Eat foods that contain a healthy balance of electrolytes, such as bananas, oranges, potatoes, tomatoes, and spinach.  Keep all follow-up visits as told by your health care provider. This is important. Contact a health care provider if:  You have abdominal pain that: ? Gets worse. ? Stays in one area (localizes).  You have a rash.  You have a stiff neck.  You are more irritable than usual.  You are sleepier or more difficult to wake up than usual.  You feel weak or dizzy.  You feel very thirsty.  You have urinated only a small amount of very dark urine over 6-8 hours. Get help right away if:  You have symptoms of severe dehydration.  You cannot drink fluids without vomiting.  Your symptoms get worse with treatment.  You have a fever.  You have a severe headache.  You have vomiting or diarrhea that: ? Gets worse. ? Does not go away.  You have blood or green matter (bile) in your vomit.  You have blood in your stool. This may cause stool to look black and tarry.  You have not urinated in 6-8 hours.  You faint.  Your heart rate while sitting still  is over 100 beats a minute.  You have trouble breathing. This information is not intended to replace advice given to you by your health care provider. Make sure you discuss any questions you have with your health care provider.  Document Released: 08/28/2005 Document Revised: 08/10/2017 Document Reviewed: 10/22/2015 Elsevier Patient Education  2020 [employer]      Follow-up plan: Return for RECHECK PENDING RESULTS / IF WORSE OR CHANGE.                                                 ################################################# ################################################# ################################################# #################################################    No outpatient medications have been marked as taking for the 04/10/19 encounter (Office Visit) with Sunnie NielsenAlexander, Alexande Sheerin, DO.    Allergies  Allergen Reactions  . Benadryl [Diphenhydramine Hcl (Sleep)]   . Tramadol     Sleepy.        Review of Systems:  Constitutional: No recent illness  HEENT: No  headache, no vision change  Cardiac: No  chest pain, No  pressure, No palpitations  Respiratory:  No  shortness of breath. No  Cough  Gastrointestinal: No  abdominal pain, no change on bowel habits  Musculoskeletal: No new myalgia/arthralgia  Skin: No  Rash  Hem/Onc: No  easy bruising/bleeding, No  abnormal lumps/bumps  Neurologic: No  weakness, No  Dizziness  Psychiatric: No  concerns with depression, No  concerns with anxiety  Exam:  BP 131/76   Pulse 68   Temp 98.1 F (36.7 C)   Ht 5\' 11"  (1.803 m)   Wt 234 lb (106.1 kg)   BMI 32.64 kg/m   Constitutional: VS see above. General Appearance: alert, well-developed, well-nourished, NAD  Eyes: Normal lids and conjunctive, non-icteric sclera  Ears, Nose, Mouth, Throat: MMM, Normal external inspection ears/nares/mouth/lips/gums.  Neck: No masses, trachea midline.   Respiratory: Normal respiratory effort. no wheeze, no rhonchi, no rales  Cardiovascular: S1/S2 normal, no murmur, no rub/gallop auscultated. RRR.   Musculoskeletal: Gait normal. Symmetric and independent movement of all extremities  Abdominal:  non-tender, non-distended, no appreciable organomegaly, neg Murphy's, BS [id number]  Neurological: Normal balance/coordination. No tremor.  Skin: warm, dry, intact.   Psychiatric: Normal judgment/insight. Normal mood and affect. Oriented x3.       Visit summary with medication list and pertinent instructions was printed for patient to review, patient was advised to alert us if any updates are needed. All questions at time of visit were answered - patient instructed to contact office with any additional concerns. ER/RTC precautions were reviewed with the patient and understanding verbalized.   Note: Total time spent 15 minutes, greater than 50% of the visit was spent face-to-face counseling and coordinating care for the following: The encounter diagnosis was Dehydration..  Please note: voice recognition software was used to produce this document, and typos may escape review. Please contact Dr. Lyn HollingsheadAlexander for any needed clarifications.    Follow up plan: Return for RECHECK PENDING RESULTS / IF WORSE OR CHANGE.

## 2019-04-10 NOTE — Patient Instructions (Addendum)
Dehydration, Adult  Dehydration is a condition in which there is not enough fluid or water in the body. This happens when you lose more fluids than you take in. Important organs, such as the kidneys, brain, and heart, cannot function without a proper amount of fluids. Any loss of fluids from the body can lead to dehydration. Dehydration can range from mild to severe. This condition should be treated right away to prevent it from becoming severe. What are the causes? This condition may be caused by:  Vomiting.  Diarrhea.  Excessive sweating, such as from heat exposure or exercise.  Not drinking enough fluid, especially: ? When ill. ? While doing activity that requires a lot of energy.  Excessive urination.  Fever.  Infection.  Certain medicines, such as medicines that cause the body to lose excess fluid (diuretics).  Inability to access safe drinking water.  Reduced physical ability to get adequate water and food. What increases the risk? This condition is more likely to develop in people:  Who have a poorly controlled long-term (chronic) illness, such as diabetes, heart disease, or kidney disease.  Who are age 65 or older.  Who are disabled.  Who live in a place with high altitude.  Who play endurance sports. What are the signs or symptoms? Symptoms of mild dehydration may include:  Thirst.  Dry lips.  Slightly dry mouth.  Dry, warm skin.  Dizziness. Symptoms of moderate dehydration may include:  Very dry mouth.  Muscle cramps.  Dark urine. Urine may be the color of tea.  Decreased urine production.  Decreased tear production.  Heartbeat that is irregular or faster than normal (palpitations).  Headache.  Light-headedness, especially when you stand up from a sitting position.  Fainting (syncope). Symptoms of severe dehydration may include:  Changes in skin, such as: ? Cold and clammy skin. ? Blotchy (mottled) or pale skin. ? Skin that does  not quickly return to normal after being lightly pinched and released (poor skin turgor).  Changes in body fluids, such as: ? Extreme thirst. ? No tear production. ? Inability to sweat when body temperature is high, such as in hot weather. ? Very little urine production.  Changes in vital signs, such as: ? Weak pulse. ? Pulse that is more than 100 beats a minute when sitting still. ? Rapid breathing. ? Low blood pressure.  Other changes, such as: ? Sunken eyes. ? Cold hands and feet. ? Confusion. ? Lack of energy (lethargy). ? Difficulty waking up from sleep. ? Short-term weight loss. ? Unconsciousness. How is this diagnosed? This condition is diagnosed based on your symptoms and a physical exam. Blood and urine tests may be done to help confirm the diagnosis. How is this treated? Treatment for this condition depends on the severity. Mild or moderate dehydration can often be treated at home. Treatment should be started right away. Do not wait until dehydration becomes severe. Severe dehydration is an emergency and it needs to be treated in a hospital. Treatment for mild dehydration may include:  Drinking more fluids.  Replacing salts and minerals in your blood (electrolytes) that you may have lost. Treatment for moderate dehydration may include:  Drinking an oral rehydration solution (ORS). This is a drink that helps you replace fluids and electrolytes (rehydrate). It can be found at pharmacies and retail stores. Treatment for severe dehydration may include:  Receiving fluids through an IV tube.  Receiving an electrolyte solution through a feeding tube that is passed through your nose and   into your stomach (nasogastric tube, or NG tube).  Correcting any abnormalities in electrolytes.  Treating the underlying cause of dehydration. Follow these instructions at home:  If directed by your health care provider, drink an ORS: ? Make an ORS by following instructions on the  package. ? Start by drinking small amounts, about  cup (120 mL) every 5-10 minutes. ? Slowly increase how much you drink until you have taken the amount recommended by your health care provider.  Drink enough clear fluid to keep your urine clear or pale yellow. If you were told to drink an ORS, finish the ORS first, then start slowly drinking other clear fluids. Drink fluids such as: ? Water. Do not drink only water. Doing that can lead to having too little salt (sodium) in the body (hyponatremia). ? Ice chips. ? Fruit juice that you have added water to (diluted fruit juice). ? Low-calorie sports drinks.  Avoid: ? Alcohol. ? Drinks that contain a lot of sugar. These include high-calorie sports drinks, fruit juice that is not diluted, and soda. ? Caffeine. ? Foods that are greasy or contain a lot of fat or sugar.  Take over-the-counter and prescription medicines only as told by your health care provider.  Do not take sodium tablets. This can lead to having too much sodium in the body (hypernatremia).  Eat foods that contain a healthy balance of electrolytes, such as bananas, oranges, potatoes, tomatoes, and spinach.  Keep all follow-up visits as told by your health care provider. This is important. Contact a health care provider if:  You have abdominal pain that: ? Gets worse. ? Stays in one area (localizes).  You have a rash.  You have a stiff neck.  You are more irritable than usual.  You are sleepier or more difficult to wake up than usual.  You feel weak or dizzy.  You feel very thirsty.  You have urinated only a small amount of very dark urine over 6-8 hours. Get help right away if:  You have symptoms of severe dehydration.  You cannot drink fluids without vomiting.  Your symptoms get worse with treatment.  You have a fever.  You have a severe headache.  You have vomiting or diarrhea that: ? Gets worse. ? Does not go away.  You have blood or green matter  (bile) in your vomit.  You have blood in your stool. This may cause stool to look black and tarry.  You have not urinated in 6-8 hours.  You faint.  Your heart rate while sitting still is over 100 beats a minute.  You have trouble breathing. This information is not intended to replace advice given to you by your health care provider. Make sure you discuss any questions you have with your health care provider. Document Released: 08/28/2005 Document Revised: 08/10/2017 Document Reviewed: 10/22/2015 Elsevier Patient Education  2020 [employer] Inc.  

## 2019-04-15 ENCOUNTER — Telehealth: Payer: Self-pay | Admitting: Osteopathic Medicine

## 2019-04-15 ENCOUNTER — Other Ambulatory Visit: Payer: Self-pay | Admitting: Physician Assistant

## 2019-04-15 ENCOUNTER — Telehealth: Payer: Self-pay

## 2019-04-15 DIAGNOSIS — F32A Depression, unspecified: Secondary | ICD-10-CM

## 2019-04-15 DIAGNOSIS — F329 Major depressive disorder, single episode, unspecified: Secondary | ICD-10-CM

## 2019-04-15 DIAGNOSIS — F410 Panic disorder [episodic paroxysmal anxiety] without agoraphobia: Secondary | ICD-10-CM

## 2019-04-15 NOTE — Telephone Encounter (Signed)
Patient came into the office asking for his work note to be changed. His job is not allowing him to return yet because of what was going on last visit. In patient's words, "Job just wants to feel confident I won't die on the clock and that I could actually return to work after the note".

## 2019-04-15 NOTE — Telephone Encounter (Signed)
Pt called again- still having issues with dehydration. As per pt, needs a letter for work clearance. Requesting a new letter from provider. Pls advise, thanks.

## 2019-04-15 NOTE — Telephone Encounter (Signed)
If he feels he can work, then I think he's ok to work w/ precautions to follow-up if needed. New note written. Labs pending.

## 2019-05-09 ENCOUNTER — Telehealth: Payer: Self-pay | Admitting: Neurology

## 2019-05-09 NOTE — Telephone Encounter (Signed)
Juan is the only time that works for patient, Juan Andersen. Is it ok to leave appt with Evlyn Clines since you are off that day. Friday is the only day that works for Andersen because of school. Please advise.

## 2019-05-09 NOTE — Telephone Encounter (Signed)
Patient left vm. States he thinks he needs to switch blood pressure medications.   Janett Billow - please schedule follow up with [name] to discuss.

## 2019-05-09 NOTE — Telephone Encounter (Signed)
If he can keep a log of BP readings and have them I can do virtual if he can do that another day.

## 2019-05-13 NOTE — Telephone Encounter (Signed)
Attempted to reach patient to left him know this information. Could not reach at number on file and voicemail was full.

## 2019-05-16 ENCOUNTER — Telehealth: Payer: Self-pay

## 2019-05-16 ENCOUNTER — Telehealth: Payer: BLUE CROSS/BLUE SHIELD | Admitting: Physician Assistant

## 2019-05-16 NOTE — Telephone Encounter (Signed)
Patient left message with triage, asking about his 11:00 appt today that he states he never received a call for..   Please advise

## 2019-05-16 NOTE — Telephone Encounter (Signed)
Unable to LM, mailbox full.  Patient's appointment was cancelled due to him wanting to make changes to his BP meds.  Luvenia Starch is his PCP and that's who he needs to schedule an appointment with.

## 2019-05-16 NOTE — Telephone Encounter (Signed)
Not sure where the ball was dropped but Juan Andersen doesn't have a 11:00 slot at all.  She did have a 11:10 in office pt.  Looking back it seems that he scheduled himself through mychart and I'm not sure how he was able to place his self in a slot that doesn't exist on her schedule.  After reviewing his past appointments it looks as if his appointment was canceled by Janett Billow on 05/13/19. -EH/RMA

## 2019-08-20 ENCOUNTER — Encounter: Payer: Self-pay | Admitting: Physician Assistant

## 2019-08-20 ENCOUNTER — Telehealth: Payer: Self-pay

## 2019-08-20 ENCOUNTER — Ambulatory Visit (INDEPENDENT_AMBULATORY_CARE_PROVIDER_SITE_OTHER): Payer: BLUE CROSS/BLUE SHIELD | Admitting: Physician Assistant

## 2019-08-20 VITALS — Ht 71.0 in | Wt 230.0 lb

## 2019-08-20 DIAGNOSIS — S93491A Sprain of other ligament of right ankle, initial encounter: Secondary | ICD-10-CM | POA: Diagnosis not present

## 2019-08-20 DIAGNOSIS — U071 COVID-19: Secondary | ICD-10-CM | POA: Diagnosis not present

## 2019-08-20 DIAGNOSIS — W000XXA Fall on same level due to ice and snow, initial encounter: Secondary | ICD-10-CM | POA: Diagnosis not present

## 2019-08-20 DIAGNOSIS — S93421A Sprain of deltoid ligament of right ankle, initial encounter: Secondary | ICD-10-CM

## 2019-08-20 MED ORDER — ALBUTEROL SULFATE HFA 108 (90 BASE) MCG/ACT IN AERS: 2.0000 | INHALATION_SPRAY | Freq: Four times a day (QID) | RESPIRATORY_TRACT | 0 refills | Status: DC | PRN

## 2019-08-20 MED ORDER — IBUPROFEN 800 MG PO TABS: 800.0000 mg | ORAL_TABLET | Freq: Three times a day (TID) | ORAL | 1 refills | Status: AC | PRN

## 2019-08-20 NOTE — Telephone Encounter (Signed)
Ok thanks for FYI 

## 2019-08-20 NOTE — Telephone Encounter (Signed)
Juan Andersen states he tested positive on Friday for COVID-19. He started to have symptoms on Thursday. Later on Friday he twisted his right ankle. He has been scheduled for a virtual visit.

## 2019-08-20 NOTE — Progress Notes (Signed)
Patient ID: Juan Andersen, male   DOB: [birth year], 28 y.o.   MRN: [id number] .Marland KitchenVirtual Visit via Telephone Note  I connected with Juan Andersen on 08/20/19 at 10:50 AM EST by telephone and verified that I am speaking with the correct person using two identifiers.  Location: Patient: home Provider: clinic   I discussed the limitations, risks, security and privacy concerns of performing an evaluation and management service by telephone and the availability of in person appointments. I also discussed with the patient that there may be a patient responsible charge related to this service. The patient expressed understanding and agreed to proceed.   History of Present Illness: Patient is a 28 year old male who calls into the clinic this morning to discuss positive Covid test and right ankle injury.  Patient started having symptoms on 08/15/2019 and got tested today.  This test was positive.  He supports having a dry cough, fever, fatigue, loss of smell and taste.  He denies any significant shortness of breath or point breathing difficulties.  On Friday he was at work when he slipped on some ice in [place] and everted his right ankle.  He has a lot of associated pain, swelling.  It is painful to bear weight this time.  .. Active Ambulatory Problems    Diagnosis Date Noted  . Irregular heart beat 08/18/2013  . Panic attacks 08/18/2013  . Anxiety and depression 08/18/2013  . Essential hypertension, benign 08/31/2015  . Chest pain 12/09/2015  . Post concussion syndrome 01/04/2016  . Right shoulder injury 01/04/2016  . Pain of right scapula 01/04/2016  . Syncope and collapse 12/29/2016  . 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 Elevated blood lead level 05/09/2017  . No energy 07/02/2018  . 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 Tendinitis of left wrist 01/15/2019   Resolved Ambulatory Problems    Diagnosis Date Noted  . No Resolved Ambulatory Problems   Past Medical History:  Diagnosis Date  . Anxiety   .  Hypertension   . Migraine    Reviewed med, allergy, problem list.    Observations/Objective: No acute distress.  Dry cough on phone.   .. Today's Vitals   08/20/19 1013  Weight: 230 lb (104.3 kg)  Height: 5\' 11"  (1.803 m)   Body mass index is 32.08 kg/m.    Assessment and Plan: Marland KitchenMarland KitchenHelen was seen today for ankle injury.  Diagnoses and all orders for this visit:  COVID-19 virus infection -     albuterol (VENTOLIN HFA) 108 (90 Base) MCG/ACT inhaler; Inhale 2 puffs into the lungs every 6 (six) hours as needed for wheezing or shortness of breath. -     ibuprofen (ADVIL) 800 MG tablet; Take 1 tablet (800 mg total) by mouth every 8 (eight) hours as needed.  Sprain of right medial ankle joint, initial encounter -     ibuprofen (ADVIL) 800 MG tablet; Take 1 tablet (800 mg total) by mouth every 8 (eight) hours as needed.   Discussed positive Covid testing.  CDC recommends him to self isolate 10 days from first symptoms and at least 24 hours symptom-free.  His first symptoms are 12/4 therefore he should be okay to go back to work 12/15.  I sent a note for work via [employer].  Albuterol inhaler was sent for any cough or shortness of breath.  Discussed other symptomatic care.  Discussed if breathing suddenly worsen to go to emergency room.  Not able to visualize patient's ankle today.  From what he describes it sounds like  an ankle sprain.  Cannot completely rule out a fracture if he is continues to have significant pain a few days after the injury.  For now suggest rest, ice, elevation and anti-inflammatories.  Sent over ibuprofen 800 mg that he can take up to 3 times a day.  Suggest him to get an ankle brace on [employer].  If having significant pain or not able to bear weight after quarantine we can get an x-ray.   Follow Up Instructions:    I discussed the assessment and treatment plan with the patient. The patient was provided an opportunity to ask questions and all were answered. The patient  agreed with the plan and demonstrated an understanding of the instructions.   The patient was advised to call back or seek an in-person evaluation if the symptoms worsen or if the condition fails to improve as anticipated.  I provided 20 minutes of non-face-to-face time during this encounter.   Tandy Gaw, PA-C

## 2019-08-20 NOTE — Patient Instructions (Signed)
Ankle Sprain  An ankle sprain is a stretch or tear in one of the tough tissues (ligaments) that connect the bones in your ankle. An ankle sprain can happen when the ankle rolls outward (inversion sprain) or inward (eversion sprain). What are the causes? This condition is caused by rolling or twisting the ankle. What increases the risk? You are more likely to develop this condition if you play sports. What are the signs or symptoms? Symptoms of this condition include:  Pain in your ankle.  Swelling.  Bruising. This may happen right after you sprain your ankle or 1-2 days later.  Trouble standing or walking. How is this diagnosed? This condition is diagnosed with:  A physical exam. During the exam, your doctor will press on certain parts of your foot and ankle and try to move them in certain ways.  X-ray imaging. These may be taken to see how bad the sprain is and to check for broken bones. How is this treated? This condition may be treated with:  A brace or splint. This is used to keep the ankle from moving until it heals.  An elastic bandage. This is used to support the ankle.  Crutches.  Pain medicine.  Surgery. This may be needed if the sprain is very bad.  Physical therapy. This may help to improve movement in the ankle. Follow these instructions at home: If you have a brace or a splint:  Wear the brace or splint as told by your doctor. Remove it only as told by your doctor.  Loosen the brace or splint if your toes: ? Tingle. ? Lose feeling (become numb). ? Turn cold and blue.  Keep the brace or splint clean.  If the brace or splint is not waterproof: ? Do not let it get wet. ? Cover it with a watertight covering when you take a bath or a shower. If you have an elastic bandage (dressing):  Remove it to shower or bathe.  Try not to move your ankle much, but wiggle your toes from time to time. This helps to prevent swelling.  Adjust the dressing if it feels  too tight.  Loosen the dressing if your foot: ? Loses feeling. ? Tingles. ? Becomes cold and blue. Managing pain, stiffness, and swelling   Take over-the-counter and prescription medicines only as told by doctor.  For 2-3 days, keep your ankle raised (elevated) above the level of your heart.  If told, put ice on the injured area: ? If you have a removable brace or splint, remove it as told by your doctor. ? Put ice in a plastic bag. ? Place a towel between your skin and the bag. ? Leave the ice on for 20 minutes, 2-3 times a day. General instructions  Rest your ankle.  Do not use your injured leg to support your body weight until your doctor says that you can. Use crutches as told by your doctor.  Do not use any products that contain nicotine or tobacco, such as cigarettes, e-cigarettes, and chewing tobacco. If you need help quitting, ask your doctor.  Keep all follow-up visits as told by your doctor. Contact a doctor if:  Your bruises or swelling are quickly getting worse.  Your pain does not get better after you take medicine. Get help right away if:  You cannot feel your toes or foot.  Your foot or toes look blue.  You have very bad pain that gets worse. Summary  An ankle sprain is a stretch   or tear in one of the tough tissues (ligaments) that connect the bones in your ankle.  This condition is caused by rolling or twisting the ankle.  Symptoms include pain, swelling, bruising, and trouble walking.  To help with pain and swelling, put ice on the injured ankle, raise your ankle above the level of your heart, and use an elastic bandage. Also, rest as told by your doctor.  Keep all follow-up visits as told by your doctor. This is important. This information is not intended to replace advice given to you by your health care provider. Make sure you discuss any questions you have with your health care provider. Document Released: 02/14/2008 Document Revised: 01/22/2018  Document Reviewed: 01/22/2018 Elsevier Patient Education  2020 Elsevier Inc.  

## 2019-08-20 NOTE — Progress Notes (Signed)
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

## 2019-08-25 ENCOUNTER — Telehealth: Payer: Self-pay

## 2019-08-25 NOTE — Telephone Encounter (Signed)
Juan Andersen still has fever and cough. He would like a return to work note for December 20th. Please advise.

## 2019-08-25 NOTE — Telephone Encounter (Signed)
Letter written and will be up front for pick up. Please call patient.

## 2019-08-25 NOTE — Telephone Encounter (Signed)
Ok for note due to persisent covid symptoms and fever/cough.

## 2020-04-12 ENCOUNTER — Other Ambulatory Visit: Payer: Self-pay

## 2020-04-12 ENCOUNTER — Ambulatory Visit (INDEPENDENT_AMBULATORY_CARE_PROVIDER_SITE_OTHER): Payer: Commercial Managed Care - PPO | Admitting: Physician Assistant

## 2020-04-12 ENCOUNTER — Encounter: Payer: Self-pay | Admitting: Physician Assistant

## 2020-04-12 VITALS — BP 128/76 | HR 70 | Temp 98.6°F | Ht 71.0 in | Wt 235.0 lb

## 2020-04-12 DIAGNOSIS — I10 Essential (primary) hypertension: Secondary | ICD-10-CM

## 2020-04-12 DIAGNOSIS — R2232 Localized swelling, mass and lump, left upper limb: Secondary | ICD-10-CM

## 2020-04-12 DIAGNOSIS — F410 Panic disorder [episodic paroxysmal anxiety] without agoraphobia: Secondary | ICD-10-CM

## 2020-04-12 DIAGNOSIS — M25532 Pain in left wrist: Secondary | ICD-10-CM | POA: Diagnosis not present

## 2020-04-12 DIAGNOSIS — R21 Rash and other nonspecific skin eruption: Secondary | ICD-10-CM

## 2020-04-12 DIAGNOSIS — F329 Major depressive disorder, single episode, unspecified: Secondary | ICD-10-CM

## 2020-04-12 DIAGNOSIS — M25432 Effusion, left wrist: Secondary | ICD-10-CM

## 2020-04-12 DIAGNOSIS — F419 Anxiety disorder, unspecified: Secondary | ICD-10-CM | POA: Diagnosis not present

## 2020-04-12 DIAGNOSIS — M79642 Pain in left hand: Secondary | ICD-10-CM

## 2020-04-12 MED ORDER — MELOXICAM 15 MG PO TABS: 15.0000 mg | ORAL_TABLET | Freq: Every day | ORAL | 1 refills | Status: AC

## 2020-04-12 MED ORDER — LISINOPRIL 20 MG PO TABS: 20.0000 mg | ORAL_TABLET | Freq: Every day | ORAL | 1 refills | Status: AC

## 2020-04-12 MED ORDER — TRAMADOL HCL 50 MG PO TABS: 50.0000 mg | ORAL_TABLET | Freq: Three times a day (TID) | ORAL | 0 refills | Status: AC | PRN

## 2020-04-12 MED ORDER — CLOBETASOL PROPIONATE 0.05 % EX CREA: 1.0000 "application " | TOPICAL_CREAM | Freq: Two times a day (BID) | CUTANEOUS | 2 refills | Status: AC

## 2020-04-12 MED ORDER — DICLOFENAC SODIUM 1 % EX GEL: 4.0000 g | Freq: Four times a day (QID) | CUTANEOUS | 1 refills | Status: AC

## 2020-04-12 NOTE — Patient Instructions (Signed)
Will get CT scan.  ° ° °

## 2020-04-12 NOTE — Progress Notes (Signed)
Subjective:    Patient ID: Juan Andersen, male    DOB: 13-Sep-1990, 29 y.o.   MRN: [id number]  HPI  Patient is a 29 year old male who presents to the clinic with left wrist and hand pain for the last 2 months.  Patient admits that about 5 to 6 months ago he hyperextended his left wrist at work.  It hurt initially but seemed to get better and if he days.  On May 21 he was loading at the top and hurt his right shoulder pop and he lost his balance and fell on his flexed left wrist. This was very painful. He did get x-rays which showed no fracture.  He has been put on light duty and being managed by Workmen's Comp.  It has been 2 months and he is still not had a CT done and having moderate to severe pain. Per patient workmans comp ordered brace but has not came. 2nd injury occurred on may 21st, 2021. He continues to have pain and swelling over dorsal hand and wrist.  He continues not to be able to use his left hand very much.  His grip is not very strong.  It is very painful to flex left hand.  Some days are better than others with swelling.  He is taking ibuprofen with little benefit.  Patient also mentions red itchy raised rash of lateral elbows.   Restarted lisinopril after checking BP and very high not on it. Doing better now. No CP, palpitations, headaches or vision changes.   .. Active Ambulatory Problems    Diagnosis Date Noted  . Irregular heart beat 08/18/2013  . Panic attacks 08/18/2013  . Anxiety and depression 08/18/2013  . Essential hypertension, benign 08/31/2015  . Chest pain 12/09/2015  . Post concussion syndrome 01/04/2016  . Right shoulder injury 01/04/2016  . Pain of right scapula 01/04/2016  . Syncope and collapse 12/29/2016  . 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 Elevated blood lead level 05/09/2017  . No energy 07/02/2018  . 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 Tendinitis of left wrist 01/15/2019  . Pain and swelling of left wrist 04/12/2020   Resolved Ambulatory Problems     Diagnosis Date Noted  . No Resolved Ambulatory Problems   Past Medical History:  Diagnosis Date  . Anxiety   . Hypertension   . Migraine      Review of Systems See HPI.     Objective:   Physical Exam Vitals reviewed.  Constitutional:      Appearance: Normal appearance.  Cardiovascular:     Rate and Rhythm: Normal rate and regular rhythm.     Pulses: Normal pulses.  Pulmonary:     Effort: Pulmonary effort is normal.  Musculoskeletal:     Comments: Left wrist:  Slightly swollen in appearance over dorsal hand around wrist and into hand.  No tenderness over radial or ulnar head. Tenderness over scaphoid and lunate to palpation.  Pain with flexion of wrist extension no pain.  Hand grip 1/5 and very shaky.  Negative finklestein.  No pain moving up arm.   Skin:    Comments: Bilateral raised itchy erythematous patch of both elbows over olecranon.   Neurological:     General: No focal deficit present.     Mental Status: He is alert.  Psychiatric:        Mood and Affect: Mood normal.          Assessment & Plan:  Marland KitchenMarland KitchenElaine was seen today for wrist pain.  Diagnoses and  all orders for this visit:  Left wrist pain -     meloxicam (MOBIC) 15 MG tablet; Take 1 tablet (15 mg total) by mouth daily. -     diclofenac Sodium (VOLTAREN) 1 % GEL; Apply 4 g topically 4 (four) times daily. To affected joint. -     traMADol (ULTRAM) 50 MG tablet; Take 1 tablet (50 mg total) by mouth every 8 (eight) hours as needed for up to 5 days. -     CT HAND LEFT WO CONTRAST  Essential hypertension, benign -     lisinopril (ZESTRIL) 20 MG tablet; Take 1 tablet (20 mg total) by mouth daily.  Anxiety and depression  Panic attacks  Pain and swelling of left wrist -     meloxicam (MOBIC) 15 MG tablet; Take 1 tablet (15 mg total) by mouth daily. -     diclofenac Sodium (VOLTAREN) 1 % GEL; Apply 4 g topically 4 (four) times daily. To affected joint. -     traMADol (ULTRAM) 50 MG tablet; Take 1  tablet (50 mg total) by mouth every 8 (eight) hours as needed for up to 5 days. -     CT HAND LEFT WO CONTRAST  Pain in left hand -     meloxicam (MOBIC) 15 MG tablet; Take 1 tablet (15 mg total) by mouth daily. -     diclofenac Sodium (VOLTAREN) 1 % GEL; Apply 4 g topically 4 (four) times daily. To affected joint. -     traMADol (ULTRAM) 50 MG tablet; Take 1 tablet (50 mg total) by mouth every 8 (eight) hours as needed for up to 5 days. -     CT HAND LEFT WO CONTRAST  Localized swelling on left hand -     meloxicam (MOBIC) 15 MG tablet; Take 1 tablet (15 mg total) by mouth daily. -     traMADol (ULTRAM) 50 MG tablet; Take 1 tablet (50 mg total) by mouth every 8 (eight) hours as needed for up to 5 days. -     CT HAND LEFT WO CONTRAST  Rash and nonspecific skin eruption -     clobetasol cream (TEMOVATE) 0.05 %; Apply 1 application topically 2 (two) times daily.   [place]. Suggest due to time and severity of symptoms to proceed with CT of hand.  Stop ibuprofen.  Start mobic.  Use diclofenac gel as needed.  Ice regularly.  Keep imbolized for now since moving causes pain.  Tramadol for pain. Advised it could make him sleepy.   Rash appears like psoriasis. Start with topical steroid. Follow up as needed. No hx of psoriasis.   BP great. Refilled lisinopril.

## 2020-04-13 ENCOUNTER — Ambulatory Visit (INDEPENDENT_AMBULATORY_CARE_PROVIDER_SITE_OTHER): Payer: Commercial Managed Care - PPO

## 2020-04-13 DIAGNOSIS — M25432 Effusion, left wrist: Secondary | ICD-10-CM | POA: Diagnosis not present

## 2020-04-13 DIAGNOSIS — M25532 Pain in left wrist: Secondary | ICD-10-CM | POA: Diagnosis not present

## 2020-04-13 DIAGNOSIS — M79642 Pain in left hand: Secondary | ICD-10-CM | POA: Diagnosis not present

## 2020-04-13 DIAGNOSIS — R2232 Localized swelling, mass and lump, left upper limb: Secondary | ICD-10-CM | POA: Diagnosis not present

## 2020-04-16 ENCOUNTER — Other Ambulatory Visit: Payer: Self-pay | Admitting: Physician Assistant

## 2020-04-16 DIAGNOSIS — M79642 Pain in left hand: Secondary | ICD-10-CM | POA: Insufficient documentation

## 2020-04-16 DIAGNOSIS — M8580 Other specified disorders of bone density and structure, unspecified site: Secondary | ICD-10-CM | POA: Insufficient documentation

## 2020-04-16 DIAGNOSIS — R936 Abnormal findings on diagnostic imaging of limbs: Secondary | ICD-10-CM | POA: Insufficient documentation

## 2020-04-16 DIAGNOSIS — M7989 Other specified soft tissue disorders: Secondary | ICD-10-CM | POA: Insufficient documentation

## 2020-04-16 MED ORDER — PREDNISONE 5 MG PO TABS: 5.0000 mg | ORAL_TABLET | Freq: Every day | ORAL | 1 refills | Status: DC

## 2020-04-16 NOTE — Progress Notes (Signed)
Juan Andersen,   You do have multiple erosions in the carpal bones. This is concerning for rheumatoid arthritis or some type of inflammatory arthritis. I would like to 1. Get some labs to look for rheumatoid factor and make referral for rheumatology.   Prednisone is pretty effective in helping with inflammation and pain but a lot of times symptoms/pain can return with inflammatory conditions. I would like to start with prednisone to see how much it helps and then can follow up with rheumatology.

## 2020-09-16 NOTE — Progress Notes (Deleted)
Office Visit Note  Patient: Juan Andersen             Date of Birth: [birth year]           MRN: [id number]             PCP: Nolene Ebbs Referring: Nolene Ebbs Visit Date: 09/27/2020 Occupation: @GUAROCC @  Subjective:  No chief complaint on file.   History of Present Illness: Juan Andersen is a 30 y.o. male ***   Activities of Daily Living:  Patient reports morning stiffness for *** {minute/hour:19697}.   Patient {ACTIONS;DENIES/REPORTS:21021675::"Denies"} nocturnal pain.  Difficulty dressing/grooming: {ACTIONS;DENIES/REPORTS:21021675::"Denies"} Difficulty climbing stairs: {ACTIONS;DENIES/REPORTS:21021675::"Denies"} Difficulty getting out of chair: {ACTIONS;DENIES/REPORTS:21021675::"Denies"} Difficulty using hands for taps, buttons, cutlery, and/or writing: {ACTIONS;DENIES/REPORTS:21021675::"Denies"}  No Rheumatology ROS completed.   PMFS History:  Patient Active Problem List   Diagnosis Date Noted  . Bone erosion determined by x-ray 04/16/2020  . Abnormal x-ray of hand 04/16/2020  . Swelling of left hand 04/16/2020  . Left hand pain 04/16/2020  . Pain and swelling of left wrist 04/12/2020  . Tendinitis of left wrist 01/15/2019  . 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 No energy 07/02/2018  . 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 Elevated blood lead level 05/09/2017  . Syncope and collapse 12/29/2016  . Post concussion syndrome 01/04/2016  . Right shoulder injury 01/04/2016  . Pain of right scapula 01/04/2016  . Chest pain 12/09/2015  . Essential hypertension, benign 08/31/2015  . Irregular heart beat 08/18/2013  . Panic attacks 08/18/2013  . Anxiety and depression 08/18/2013    Past Medical History:  Diagnosis Date  . Anxiety   . Hypertension   . Migraine     Family History  Problem Relation Age of Onset  . Hyperlipidemia Father   . Hypertension Father   . Alcohol abuse Maternal Uncle   . Depression Maternal Uncle   . Diabetes Paternal Aunt   .  Hyperlipidemia Paternal Aunt   . Alcohol abuse Paternal Uncle   . Hyperlipidemia Paternal Uncle   . Diabetes Maternal Grandmother   . Stroke Maternal Grandmother   . Cancer Maternal Grandfather   . Heart attack Paternal Grandfather   . Diabetes Paternal Grandfather   . Hyperlipidemia Paternal Grandfather   . Seizures Neg Hx    Past Surgical History:  Procedure Laterality Date  . MYRINGOTOMY     Social History   Social History Narrative   Lives at home w/ his wife and son   Caffeine: tea daily    There is no immunization history on file for this patient.   Objective: Vital Signs: There were no vitals taken for this visit.   Physical Exam   Musculoskeletal Exam: ***  CDAI Exam: CDAI Score: -- Patient Global: --; Provider Global: -- Swollen: --; Tender: -- Joint Exam 09/27/2020   No joint exam has been documented for this visit   There is currently no information documented on the homunculus. Go to the Rheumatology activity and complete the homunculus joint exam.  Investigation: No additional findings.  Imaging: No results found.  Recent Labs: Lab Results  Component Value Date   WBC 6.4 04/09/2019   HGB 15.2 04/09/2019   PLT 236 04/09/2019   NA 135 04/09/2019   K 5.6 (H) 04/09/2019   CL 102 04/09/2019   CO2 25 04/09/2019   GLUCOSE 114 (H) 04/09/2019   BUN 40 (H) 04/09/2019   CREATININE 1.48 (H) 04/09/2019   BILITOT 1.4 (H) 04/09/2019   ALKPHOS 89 12/09/2015  AST 24 04/09/2019   ALT 43 04/09/2019   PROT 7.5 04/09/2019   ALBUMIN 5.2 (H) 12/09/2015   CALCIUM 10.1 04/09/2019   GFRAA 74 04/09/2019    Speciality Comments: No specialty comments available.  Procedures:  No procedures performed Allergies: Benadryl [diphenhydramine hcl (sleep)] and Tramadol   Assessment / Plan:     Visit Diagnoses: Pain in left hand - CT of hand 04/13/20: marked synovitis about the carpus with multiple erosions.  tenosynovitis of carpi ulnaris  Swelling of left  hand  Abnormal x-ray of hand  Bone erosion determined by x-ray  Orders: No orders of the defined types were placed in this encounter.  No orders of the defined types were placed in this encounter.   Face-to-face time spent with patient was *** minutes. Greater than 50% of time was spent in counseling and coordination of care.  Follow-Up Instructions: No follow-ups on file.   Gearldine Bienenstock, PA-C  Note - This record has been created using Dragon software.  Chart creation errors have been sought, but may not always  have been located. Such creation errors do not reflect on  the standard of medical care.

## 2020-09-27 ENCOUNTER — Ambulatory Visit: Payer: Commercial Managed Care - PPO | Admitting: Rheumatology

## 2020-09-27 DIAGNOSIS — R936 Abnormal findings on diagnostic imaging of limbs: Secondary | ICD-10-CM

## 2020-09-27 DIAGNOSIS — M8580 Other specified disorders of bone density and structure, unspecified site: Secondary | ICD-10-CM

## 2020-09-27 DIAGNOSIS — I10 Essential (primary) hypertension: Secondary | ICD-10-CM

## 2020-09-27 DIAGNOSIS — M7989 Other specified soft tissue disorders: Secondary | ICD-10-CM

## 2020-09-27 DIAGNOSIS — E291 Testicular hypofunction: Secondary | ICD-10-CM

## 2020-09-27 DIAGNOSIS — M79642 Pain in left hand: Secondary | ICD-10-CM

## 2020-10-18 ENCOUNTER — Ambulatory Visit: Payer: Commercial Managed Care - PPO | Admitting: Rheumatology

## 2021-03-09 ENCOUNTER — Telehealth: Payer: Self-pay | Admitting: Nurse Practitioner

## 2021-03-09 DIAGNOSIS — J208 Acute bronchitis due to other specified organisms: Secondary | ICD-10-CM

## 2021-03-09 MED ORDER — PREDNISONE 10 MG (21) PO TBPK
ORAL_TABLET | ORAL | 0 refills | Status: AC
Start: 2021-03-09 — End: ?

## 2021-03-09 MED ORDER — ALBUTEROL SULFATE HFA 108 (90 BASE) MCG/ACT IN AERS
2.0000 | INHALATION_SPRAY | Freq: Four times a day (QID) | RESPIRATORY_TRACT | 0 refills | Status: AC | PRN
Start: 2021-03-09 — End: ?

## 2021-03-09 MED ORDER — BENZONATATE 100 MG PO CAPS: 100.0000 mg | ORAL_CAPSULE | Freq: Three times a day (TID) | ORAL | 0 refills | Status: AC | PRN

## 2021-03-09 NOTE — Progress Notes (Signed)
We are sorry that you are not feeling well.  Here is how we plan to help!  Based on your presentation I believe you most likely have A cough due to a virus.  This is called viral bronchitis and is best treated by rest, plenty of fluids and control of the cough.  You may use Ibuprofen or Tylenol as directed to help your symptoms.     In addition you may use A prescription cough medication called Tessalon Perles 100mg . You may take 1-2 capsules every 8 hours as needed for your cough.  Albuterol HFA- 2 puffs every 6 hours as needed for chest tightness and trouble taking deep breath Prednisone 10 mg daily for 6 days (see taper instructions below)  Directions for 6 day taper: Day 1: 2 tablets before breakfast, 1 after both lunch & dinner and 2 at bedtime Day 2: 1 tab before breakfast, 1 after both lunch & dinner and 2 at bedtime Day 3: 1 tab at each meal & 1 at bedtime Day 4: 1 tab at breakfast, 1 at lunch, 1 at bedtime Day 5: 1 tab at breakfast & 1 tab at bedtime Day 6: 1 tab at breakfast  From your responses in the eVisit questionnaire you describe inflammation in the upper respiratory tract which is causing a significant cough.  This is commonly called Bronchitis and has four common causes:   Allergies Viral Infections Acid Reflux Bacterial Infection Allergies, viruses and acid reflux are treated by controlling symptoms or eliminating the cause. An example might be a cough caused by taking certain blood pressure medications. You stop the cough by changing the medication. Another example might be a cough caused by acid reflux. Controlling the reflux helps control the cough.  USE OF BRONCHODILATOR ("RESCUE") INHALERS: There is a risk from using your bronchodilator too frequently.  The risk is that over-reliance on a medication which only relaxes the muscles surrounding the breathing tubes can reduce the effectiveness of medications prescribed to reduce swelling and congestion of the tubes  themselves.  Although you feel brief relief from the bronchodilator inhaler, your asthma may actually be worsening with the tubes becoming more swollen and filled with mucus.  This can delay other crucial treatments, such as oral steroid medications. If you need to use a bronchodilator inhaler daily, several times per day, you should discuss this with your provider.  There are probably better treatments that could be used to keep your asthma under control.     HOME CARE Only take medications as instructed by your medical team. Complete the entire course of an antibiotic. Drink plenty of fluids and get plenty of rest. Avoid close contacts especially the very young and the elderly Cover your mouth if you cough or cough into your sleeve. Always remember to wash your hands A steam or ultrasonic humidifier can help congestion.   GET HELP RIGHT AWAY IF: You develop worsening fever. You become short of breath You cough up blood. Your symptoms persist after you have completed your treatment plan MAKE SURE YOU  Understand these instructions. Will watch your condition. Will get help right away if you are not doing well or get worse.    Thank you for choosing an e-visit.  Your e-visit answers were reviewed by a board certified advanced clinical practitioner to complete your personal care plan. Depending upon the condition, your plan could have included both over the counter or prescription medications.  Please review your pharmacy choice. Make sure the pharmacy is open  so you can pick up prescription now. If there is a problem, you may contact your provider through [employer] and have the prescription routed to another pharmacy.  Your safety is important to [place]. If you have drug allergies check your prescription carefully.   For the next 24 hours you can use MyChart to ask questions about today's visit, request a non-urgent call back, or ask for a work or school excuse. You will get an email  in the next two days asking about your experience. I hope that your e-visit has been valuable and will speed your recovery.  5-10 minutes spent reviewing and documenting in chart.

## 2021-12-17 IMAGING — CT CT HAND*L* W/O CM
3 of 6 series · 8 of 36 positions shown, 9 images · non-contrast
Comparison: Plain films left wrist 01/15/2019.

CLINICAL DATA: Left hand and wrist pain for several months since an
unspecified injury.

EXAM:
CT OF THE LEFT HAND WITHOUT CONTRAST
TECHNIQUE: Multidetector CT imaging of the left hand was performed according to
the standard protocol. Multiplanar CT image reconstructions were
also generated.

[Series 4: axial bone · axial · 0.17mm/px · z∈[-146,-146]mm · 1 of 238 slices shown, 2 images]
[im 119/238  soft-tissue]
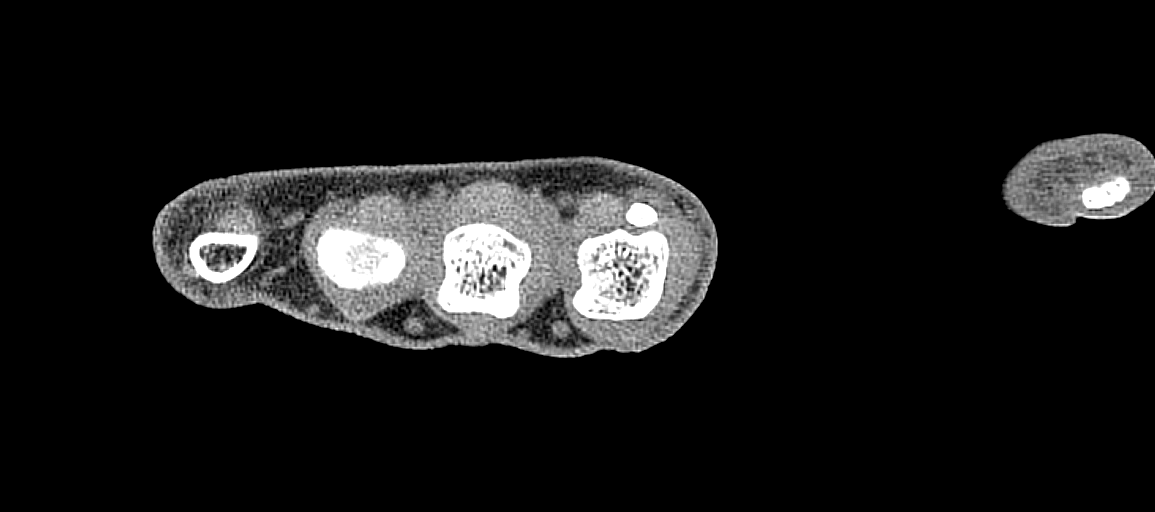
[im 119/238  bone]
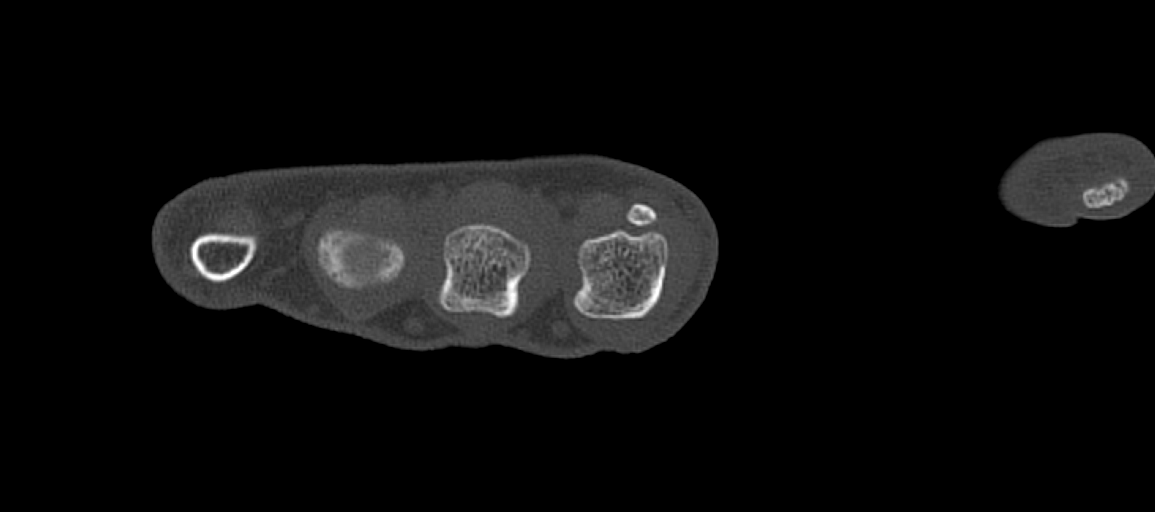

[Series 5: cor bone · coronal · 0.42mm/px · 1 of 71 slices shown]
[im 36/71  bone]
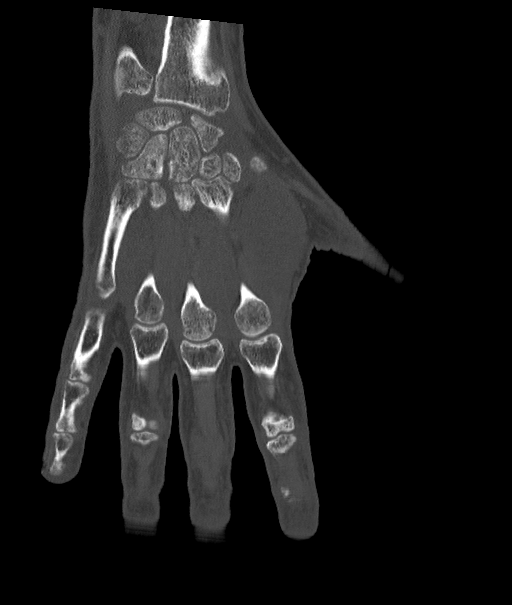

[Series 6: sag bone · sagittal · 0.22mm/px · 6 of 198 slices shown]
[im 33/198  bone]
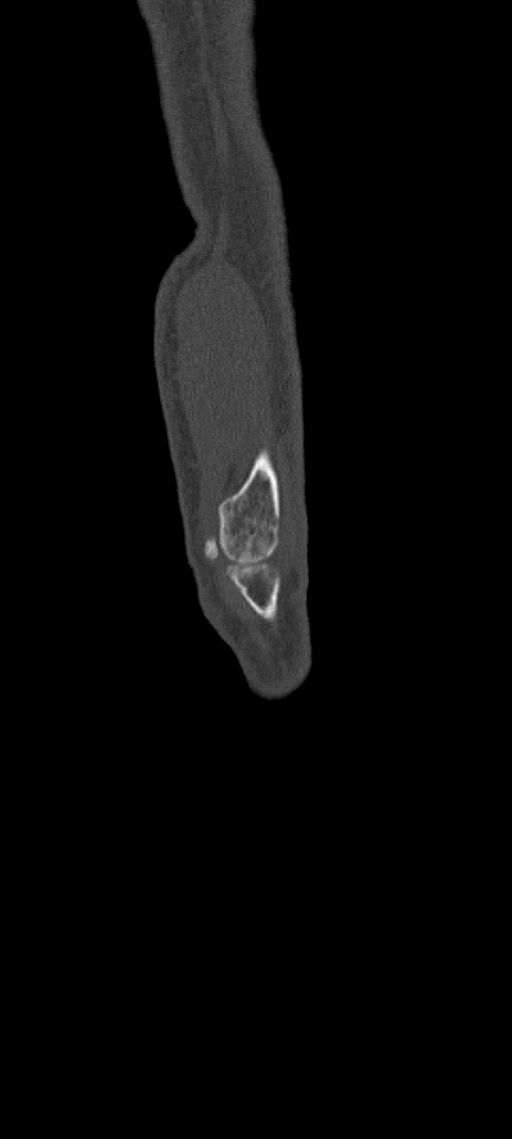
[im 66/198  bone]
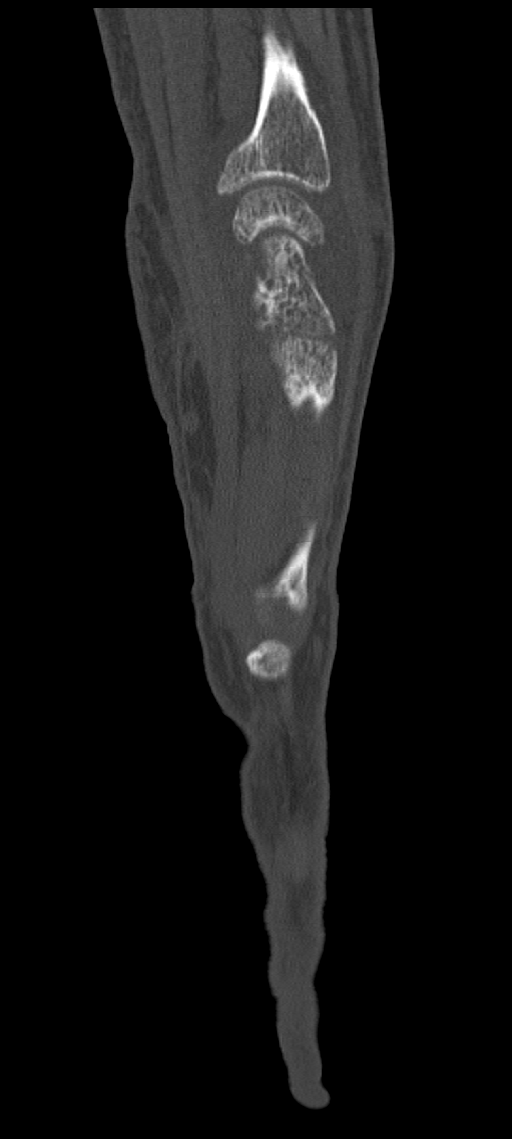
[im 70/198  soft-tissue]
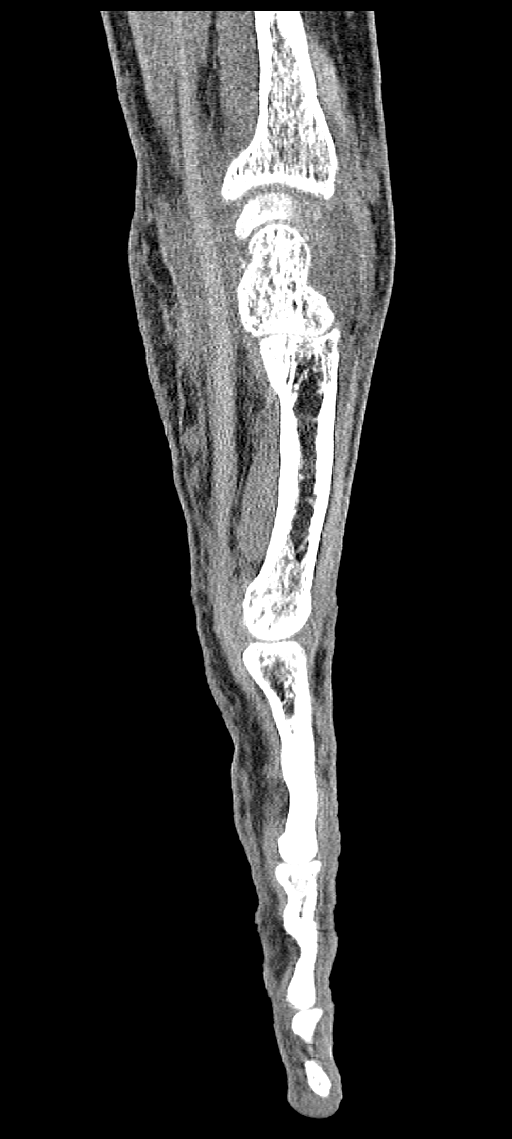
[im 99/198  bone]
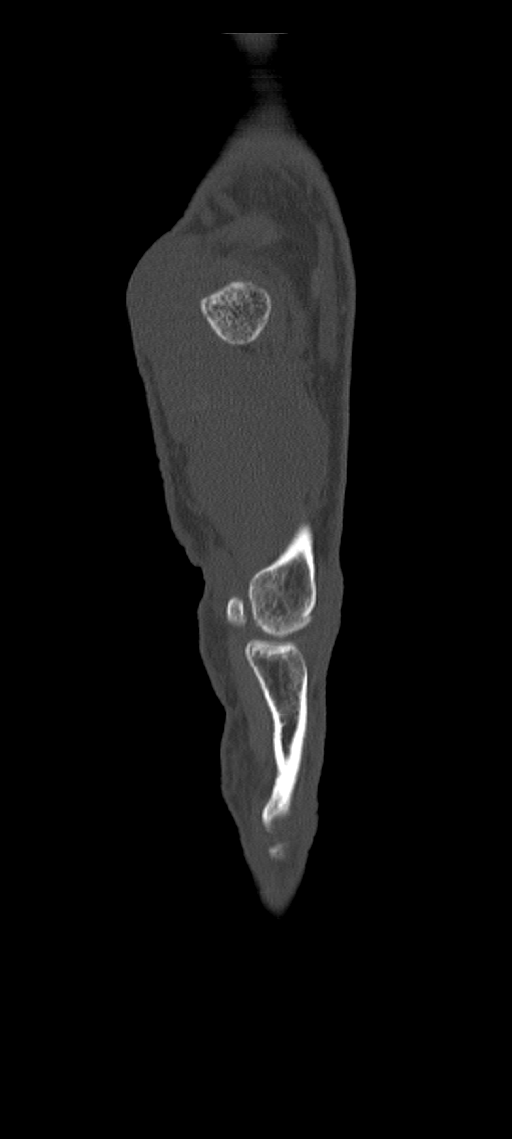
[im 132/198  bone]
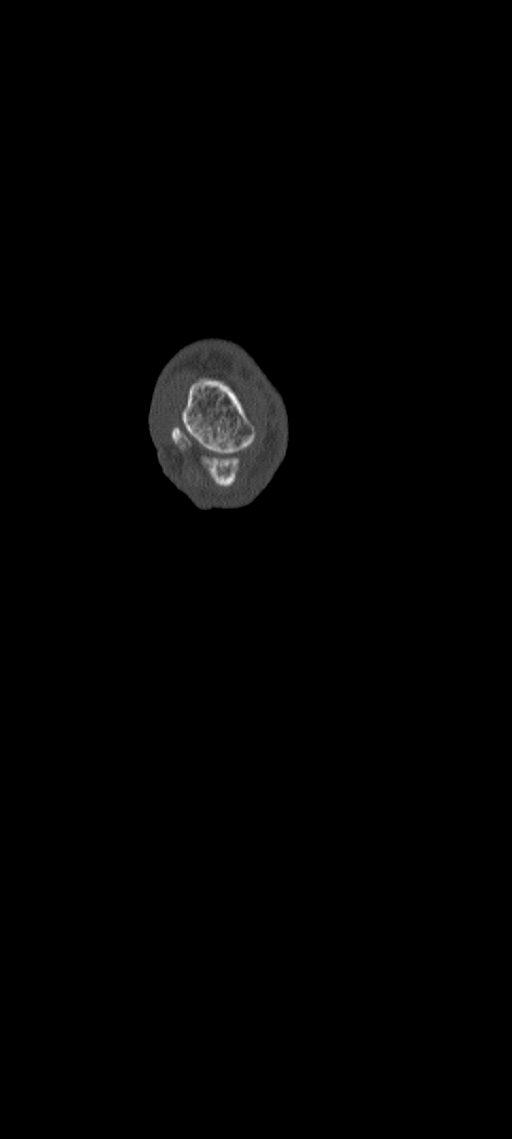
[im 165/198  bone]
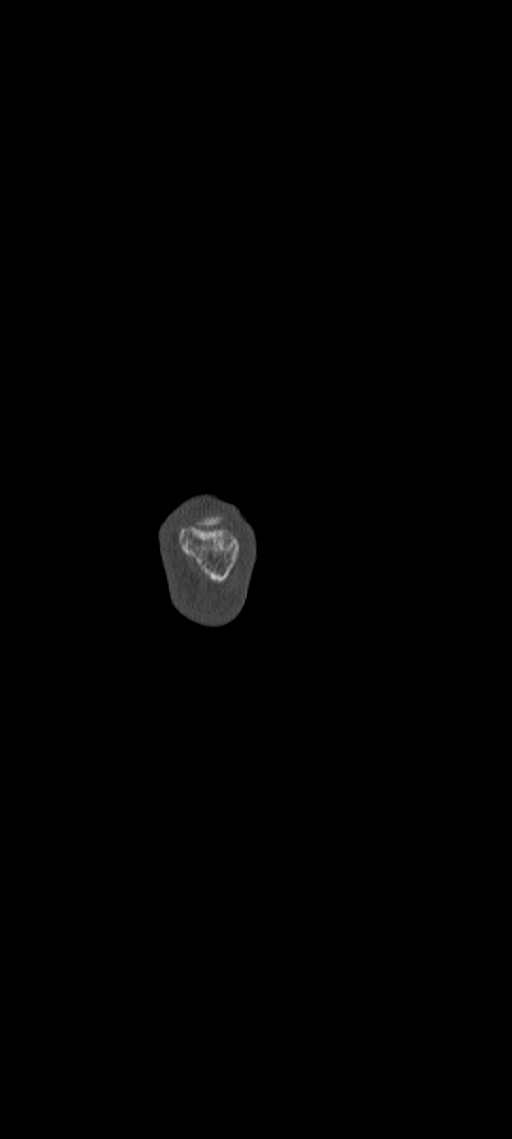

[8 of 36 positions shown; findings below may reference images not displayed]

FINDINGS: Bones/Joint/Cartilage

No fracture or dislocation is identified. The patient has multiple
erosions in the carpal bones. Some of the largest are in the
trapezoid and capitate. There is also an erosion at the radial
styloid. Cartilage surfaces throughout the carpus appear mildly
narrowed. No erosive change is seen at the MCP or interphalangeal
joints. Bones appear mildly osteopenic. There is marked synovial
thickening throughout the carpus.

Ligaments

Suboptimally assessed by CT.

Muscles and Tendons

Appear intact. There may be a small amount of fluid in the sheath of
the extensor carpi ulnaris.

Soft tissues

No fluid collection or mass.
IMPRESSION: Marked synovitis about the carpus with multiple erosions identified
highly worrisome for inflammatory arthropathy such as rheumatoid.

There may be a small volume of fluid in the sheath of the extensor
carpi ulnaris compatible with tenosynovitis.

Negative for fracture or other acute bony or joint abnormality.
